# Patient Record
Sex: Male | Born: 1975
Health system: Southern US, Community
[De-identification: ages and names within clinical notes are randomized; demographics above are authoritative.]

## PROBLEM LIST (undated history)

## (undated) ENCOUNTER — Emergency Department (HOSPITAL_COMMUNITY): Admission: EM | Payer: 59 | Source: Home / Self Care

## (undated) DIAGNOSIS — K219 Gastro-esophageal reflux disease without esophagitis: Secondary | ICD-10-CM

## (undated) DIAGNOSIS — I10 Essential (primary) hypertension: Secondary | ICD-10-CM

## (undated) DIAGNOSIS — J449 Chronic obstructive pulmonary disease, unspecified: Secondary | ICD-10-CM

## (undated) HISTORY — PX: HERNIA REPAIR: SHX51

---

## 1999-12-12 ENCOUNTER — Emergency Department (HOSPITAL_COMMUNITY): Admission: EM | Admit: 1999-12-12 | Discharge: 1999-12-12 | Payer: Self-pay | Admitting: Emergency Medicine

## 2000-08-12 ENCOUNTER — Emergency Department (HOSPITAL_COMMUNITY): Admission: EM | Admit: 2000-08-12 | Discharge: 2000-08-12 | Payer: Self-pay | Admitting: Emergency Medicine

## 2011-04-02 ENCOUNTER — Inpatient Hospital Stay (INDEPENDENT_AMBULATORY_CARE_PROVIDER_SITE_OTHER)
Admission: RE | Admit: 2011-04-02 | Discharge: 2011-04-02 | Disposition: A | Discharge status: Home / Self Care | Payer: 59 | Source: Ambulatory Visit | Attending: Family Medicine | Admitting: Family Medicine

## 2011-04-02 ENCOUNTER — Encounter: Payer: Self-pay | Admitting: Family Medicine

## 2011-04-02 DIAGNOSIS — J209 Acute bronchitis, unspecified: Secondary | ICD-10-CM

## 2011-04-02 DIAGNOSIS — E785 Hyperlipidemia, unspecified: Secondary | ICD-10-CM | POA: Insufficient documentation

## 2011-04-02 DIAGNOSIS — I10 Essential (primary) hypertension: Secondary | ICD-10-CM | POA: Insufficient documentation

## 2011-04-28 ENCOUNTER — Encounter: Payer: Self-pay | Admitting: Family Medicine

## 2011-04-28 ENCOUNTER — Inpatient Hospital Stay (INDEPENDENT_AMBULATORY_CARE_PROVIDER_SITE_OTHER)
Admission: RE | Admit: 2011-04-28 | Discharge: 2011-04-28 | Disposition: A | Discharge status: Home / Self Care | Payer: 59 | Source: Ambulatory Visit | Attending: Family Medicine | Admitting: Family Medicine

## 2011-04-28 DIAGNOSIS — J029 Acute pharyngitis, unspecified: Secondary | ICD-10-CM

## 2011-04-28 DIAGNOSIS — K219 Gastro-esophageal reflux disease without esophagitis: Secondary | ICD-10-CM

## 2011-04-28 LAB — CONVERTED CEMR LAB: Rapid Strep: NEGATIVE

## 2011-05-01 ENCOUNTER — Telehealth (INDEPENDENT_AMBULATORY_CARE_PROVIDER_SITE_OTHER): Payer: Self-pay | Admitting: *Deleted

## 2011-06-22 NOTE — Progress Notes (Signed)
Summary: COUGH,SORE THROAT...WSE rm 4   Vital Signs:  Patient Profile:   35 Years Old Male CC:      sores on tongue x 3 days Height:     74 inches Weight:      211.50 pounds O2 Sat:      97 % O2 treatment:    Room Air Temp:     98.3 degrees F oral Pulse rate:   71 / minute Resp:     18 per minute BP sitting:   129 / 85  (left arm) Cuff size:   large  Vitals Entered By: Clemens Catholic LPN (April 28, 2011 5:47 PM)                  Updated Prior Medication List: * LISINOPRIL ? MG TABS (LISINOPRIL) daily  Current Allergies (reviewed today): No known allergies History of Present Illness Chief Complaint: sores on tongue x 3 days History of Present Illness:  Subjective:  Patient reports that 3 days ago he noticed some mild soreness of his tongue upon awakening, and the next day his uvula was slightly swollen upon awakening.  His throa has also been mildly sore, and he has noticed some vague discomfort in his anterior chest when swallowing.  He still has occasional mild cough, but previous illness has resolved.  No increase in nasal congestion.  No fevers, chills, and sweats.  No chest pain.   No definite history of heartburn  REVIEW OF SYSTEMS Constitutional Symptoms      Denies fever, chills, night sweats, weight loss, weight gain, and fatigue.  Eyes       Denies change in vision, eye pain, eye discharge, glasses, contact lenses, and eye surgery. Ear/Nose/Throat/Mouth       Denies hearing loss/aids, change in hearing, ear pain, ear discharge, dizziness, frequent runny nose, frequent nose bleeds, sinus problems, sore throat, hoarseness, and tooth pain or bleeding.  Respiratory       Denies dry cough, productive cough, wheezing, shortness of breath, asthma, bronchitis, and emphysema/COPD.  Cardiovascular       Denies murmurs, chest pain, and tires easily with exhertion.    Gastrointestinal       Denies stomach pain, nausea/vomiting, diarrhea, constipation, blood in bowel  movements, and indigestion. Genitourniary       Denies painful urination, kidney stones, and loss of urinary control. Neurological       Denies paralysis, seizures, and fainting/blackouts. Musculoskeletal       Denies muscle pain, joint pain, joint stiffness, decreased range of motion, redness, swelling, muscle weakness, and gout.  Skin       Denies bruising, unusual mles/lumps or sores, and hair/skin or nail changes.  Psych       Denies mood changes, temper/anger issues, anxiety/stress, speech problems, depression, and sleep problems. Other Comments: pt c/o sores on his tongue x 3 days. no OTC meds.   Past History:  Past Medical History: Reviewed history from 04/02/2011 and no changes required. Hyperlipidemia Hypertension possible seasonal allergies  Past Surgical History: Reviewed history from 04/02/2011 and no changes required. hand surgery nasal surgery  Family History: Reviewed history from 04/02/2011 and no changes required. Family History of CAD  Family History Diabetes  cancer  Social History: Reviewed history from 04/02/2011 and no changes required. married Current Smoker 1 PPD Alcohol use-yes socially Drug use-no   Objective:  Appearance:  Patient appears healthy, stated age, and in no acute distress  Eyes:  Pupils are equal, round, and reactive  to light and accomodation.  Extraocular movement is intact.  Conjunctivae are not inflamed.  Ears:  Canals normal.  Tympanic membranes normal.   Nose:  Mildly congested turbinates.  No sinus tenderness  Mouth:  Geographic tongue Pharynx:  Slightly erythematous Neck:  Supple.  No adenopathy is present.  No thyromegaly is present  Lungs:  Clear to auscultation.  Breath sounds are equal.  Heart:  Regular rate and rhythm without murmurs, rubs, or gallops.  Abdomen:  Nontender without masses or hepatosplenomegaly.  Bowel sounds are present.  No CVA or flank tenderness.  Rapid strep test negative  Assessment New  Problems: ESOPHAGEAL REFLUX (ICD-530.81) ACUTE PHARYNGITIS (ICD-462)  SUSPECT ACID REFLUX EXACERBATED BY RECENT COUGH  Plan New Medications/Changes: OMEPRAZOLE 40 MG CPDR (OMEPRAZOLE) One by mouth Qam 30 min AC  #15 x 1, 04/28/2011, Donna Christen MD  New Orders: Est. Patient Level IV [16109] Rapid Strep [60454] Services provided After hours-Weekends-Holidays [99051] Planning Comments:   Begin Nexium.  Reflux precautions Follow-up with PCP if not improving.   The patient and/or caregiver has been counseled thoroughly with regard to medications prescribed including dosage, schedule, interactions, rationale for use, and possible side effects and they verbalize understanding.  Diagnoses and expected course of recovery discussed and will return if not improved as expected or if the condition worsens. Patient and/or caregiver verbalized understanding.  Prescriptions: OMEPRAZOLE 40 MG CPDR (OMEPRAZOLE) One by mouth Qam 30 min AC  #15 x 1   Entered and Authorized by:   Donna Christen MD   Signed by:   Donna Christen MD on 04/28/2011   Method used:   Print then Give to Patient   RxID:   [id number]   Orders Added: 1)  Est. Patient Level IV [30865] 2)  Rapid Strep [78469] 3)  Services provided After hours-Weekends-Holidays [99051]    Laboratory Results  Date/Time Received: April 28, 2011 6:32 PM  Date/Time Reported: April 28, 2011 6:32 PM   Other Tests  Rapid Strep: negative  Kit Test Internal QC: Negative   (Normal Range: Negative)

## 2011-06-22 NOTE — Progress Notes (Signed)
Summary: COUGH,FEVER,NAUSEA,HEADACHE,SORE THROAT.WSE(room 2)   Vital Signs:  Patient Profile:   35 Years Old Male CC:      URI/ general aches x 9days Height:     74 inches Weight:      215 pounds O2 Sat:      94 % O2 treatment:    Room Air Temp:     98.7 degrees F oral Pulse rate:   84 / minute Resp:     16 per minute BP sitting:   127 / 83  (left arm) Cuff size:   large  Pt. in pain?   yes  Vitals Entered By: Lavell Islam RN (April 02, 2011 8:18 PM)              Comments Patient took Ibuprofen 800mg  at 1730 today after temp of 100.2      Current Allergies: No known allergies History of Present Illness Chief Complaint: URI/ general aches x 9days History of Present Illness:  Subjective: Patient complains of URI symptoms that started about 9 days ago with a scratchy throat, and now has had a persistent cough for a week.  He smokes 1 pack per day    No pleuritic pain ? wheezing + nasal congestion ? post-nasal drainage No sinus pain/pressure No itchy/red eyes No earache No hemoptysis No SOB + low grade fever started today. No nausea No vomiting No abdominal pain No diarrhea No skin rashes No fatigue No myalgias No headache Used OTC meds without relief   REVIEW OF SYSTEMS Constitutional Symptoms       Complains of fever.     Denies chills, night sweats, weight loss, weight gain, and fatigue.  Eyes       Denies change in vision, eye pain, eye discharge, glasses, contact lenses, and eye surgery. Ear/Nose/Throat/Mouth       Complains of sinus problems, sore throat, and hoarseness.      Denies hearing loss/aids, change in hearing, ear pain, ear discharge, dizziness, frequent runny nose, frequent nose bleeds, and tooth pain or bleeding.  Respiratory       Complains of dry cough.      Denies productive cough, wheezing, shortness of breath, asthma, bronchitis, and emphysema/COPD.  Cardiovascular       Complains of tires easily with exhertion.      Denies  murmurs and chest pain.    Gastrointestinal       Complains of nausea/vomiting.      Denies stomach pain, diarrhea, constipation, blood in bowel movements, and indigestion.      Comments: no vomiting Genitourniary       Denies painful urination, kidney stones, and loss of urinary control. Neurological       Complains of headaches.      Denies paralysis, seizures, and fainting/blackouts. Musculoskeletal       Complains of muscle pain and joint pain.      Denies joint stiffness, decreased range of motion, redness, swelling, muscle weakness, and gout.  Skin       Denies bruising, unusual mles/lumps or sores, and hair/skin or nail changes.  Psych       Denies mood changes, temper/anger issues, anxiety/stress, speech problems, depression, and sleep problems. Other Comments: URI and GI problems x 9 days   Past History:  Past Medical History: Hyperlipidemia Hypertension possible seasonal allergies  Past Surgical History: hand surgery nasal surgery  Family History: Family History of CAD  Family History Diabetes  cancer  Social History: married Current Smoker Alcohol use-no Drug  use-no Smoking Status:  current Drug Use:  no   Objective:  No acute distress  Eyes:  Pupils are equal, round, and reactive to light and accomodation.  Extraocular movement is intact.  Conjunctivae are not inflamed.  Ears:  Canals normal.  Tympanic membranes normal.   Nose:  Mildly congested turbinates.  No sinus tenderness  Pharynx:  Normal  Neck:  Supple.  No adenopathy is present.  Lungs:  Faint bibasilar wheezes present.  Breath sounds are equal.  Heart:  Regular rate and rhythm without murmurs, rubs, or gallops.  Abdomen:  Nontender without masses or hepatosplenomegaly.  Bowel sounds are present.  No CVA or flank tenderness.  Extremities:  No edema.   Assessment New Problems: BRONCHITIS, ACUTE (ICD-466.0) FAMILY HISTORY DIABETES 1ST DEGREE RELATIVE (ICD-V18.0) FAMILY HISTORY OF CAD MALE  1ST DEGREE RELATIVE <50 (ICD-V17.3) HYPERTENSION (ICD-401.9) HYPERLIPIDEMIA (ICD-272.4)   Plan New Medications/Changes: CLARITHROMYCIN 500 MG TABS (CLARITHROMYCIN) One Tab by mouth two times a day  #20 x 0, 04/02/2011, Donna Christen MD  New Orders: Pulse Oximetry (single measurment) [phone] Services provided After hours-Weekends-Holidays [99051] New Patient Level III [99203]  The patient and/or caregiver has been counseled thoroughly with regard to medications prescribed including dosage, schedule, interactions, rationale for use, and possible side effects and they verbalize understanding.  Diagnoses and expected course of recovery discussed and will return if not improved as expected or if the condition worsens. Patient and/or caregiver verbalized understanding.  Prescriptions: CLARITHROMYCIN 500 MG TABS (CLARITHROMYCIN) One Tab by mouth two times a day  #20 x 0   Entered and Authorized by:   Donna Christen MD   Signed by:   Donna Christen MD on 04/02/2011   Method used:   Print then Give to Patient   RxID:   [id number]   Patient Instructions: 1)  Take Mucinex D (guaifenesin with decongestant) twice daily for congestion. 2)  Increase fluid intake, rest. 3)  May use Afrin nasal spray (or generic oxymetazoline) twice daily for about 5 days.  Also recommend using saline nasal spray several times daily and/or saline nasal irrigation. 4)  Followup with family doctor if not improving 7 to 10 days.   Orders Added: 1)  Pulse Oximetry (single measurment) [94760] 2)  Services provided After hours-Weekends-Holidays [99051] 3)  New Patient Level III [78469]

## 2011-06-22 NOTE — Telephone Encounter (Signed)
  Phone Note Outgoing Call Call back at [facility] Phone [phone]   Call placed by: Lajean Saver RN,  May 01, 2011 1:44 PM Call placed to: Patient Summary of Call: Callback: No answer. Message left to call with questions or concerns

## 2011-07-20 ENCOUNTER — Encounter: Payer: Self-pay | Admitting: Emergency Medicine

## 2011-07-20 ENCOUNTER — Emergency Department
Admission: EM | Admit: 2011-07-20 | Discharge: 2011-07-20 | Disposition: A | Discharge status: Home / Self Care | Payer: 59 | Source: Home / Self Care | Attending: Emergency Medicine | Admitting: Emergency Medicine

## 2011-07-20 DIAGNOSIS — R059 Cough, unspecified: Secondary | ICD-10-CM

## 2011-07-20 DIAGNOSIS — R05 Cough: Secondary | ICD-10-CM

## 2011-07-20 DIAGNOSIS — J209 Acute bronchitis, unspecified: Secondary | ICD-10-CM

## 2011-07-20 HISTORY — DX: Essential (primary) hypertension: I10

## 2011-07-20 MED ORDER — GUAIFENESIN-CODEINE 100-10 MG/5ML PO SYRP: 5.0000 mL | ORAL_SOLUTION | Freq: Four times a day (QID) | ORAL | Status: AC | PRN

## 2011-07-20 MED ORDER — PREDNISONE (PAK) 10 MG PO TABS: 10.0000 mg | ORAL_TABLET | Freq: Every day | ORAL | Status: AC

## 2011-07-20 MED ORDER — LEVOFLOXACIN 500 MG PO TABS: 500.0000 mg | ORAL_TABLET | Freq: Every day | ORAL | Status: AC

## 2011-07-20 NOTE — ED Notes (Signed)
Cough x 1 week, intermittent nausea, fever last night

## 2011-07-20 NOTE — ED Provider Notes (Signed)
History     CSN: [id number]  Arrival date & time 07/20/11  0811   First MD Initiated Contact with Patient 07/20/11 [phone]      Chief Complaint  Patient presents with  . Cough    (Consider location/radiation/quality/duration/timing/severity/associated sxs/prior treatment) HPI Randall Swanson is a 35 y.o. male who complains of onset of cold symptoms for 7 days. Fever since yesterday (100.8). No sore throat + cough No pleuritic pain No wheezing + nasal congestion + post-nasal drainage + sinus pain/pressure + chest congestion + wheeze No itchy/red eyes No earache No hemoptysis No SOB + chills/sweats + fever No nausea No vomiting No abdominal pain No diarrhea No skin rashes No fatigue No myalgias No headache    Past Medical History  Diagnosis Date  . Hypertension     History reviewed. No pertinent past surgical history.  No family history on file.  History  Substance Use Topics  . Smoking status: Not on file  . Smokeless tobacco: Not on file  . Alcohol Use:       Review of Systems  Allergies  Review of patient's allergies indicates no known allergies.  Home Medications   Current Outpatient Rx  Name Route Sig Dispense Refill  . LISINOPRIL 10 MG PO TABS Oral Take 10 mg by mouth daily.        BP 130/83  Pulse 92  Temp(Src) 99.5 F (37.5 C) (Oral)  Resp 18  Ht 6\' 2"  (1.88 m)  Wt 207 lb (93.895 kg)  BMI 26.58 kg/m2  SpO2 96%  Physical Exam  Nursing note and vitals reviewed. Constitutional: He is oriented to person, place, and time. He appears well-developed and well-nourished.  HENT:  Head: Normocephalic and atraumatic.  Right Ear: Tympanic membrane, external ear and ear canal normal.  Left Ear: Tympanic membrane, external ear and ear canal normal.  Nose: Mucosal edema and rhinorrhea present.  Mouth/Throat: Posterior oropharyngeal erythema present. No oropharyngeal exudate or posterior oropharyngeal edema.  Eyes: No scleral icterus.  Neck: Neck  supple.  Cardiovascular: Regular rhythm and normal heart sounds.   Pulmonary/Chest: Effort normal and breath sounds normal. No respiratory distress.  Neurological: He is alert and oriented to person, place, and time.  Skin: Skin is warm and dry.  Psychiatric: He has a normal mood and affect. His speech is normal.    ED Course  Procedures (including critical care time)  Labs Reviewed - No data to display No results found.   No diagnosis found.    MDM  1)  Take the prescribed antibiotic as instructed. 2)  Use nasal saline solution (over the counter) at least 3 times a day. 3)  Use over the counter decongestants like Zyrtec-D every 12 hours as needed to help with congestion.  If you have hypertension, do not take medicines with sudafed.  4)  Can take tylenol every 6 hours or motrin every 8 hours for pain or fever. 5)  Follow up with your primary doctor if no improvement in 5-7 days, sooner if increasing pain, fever, or new symptoms.     Lily Kocher, MD 07/20/11 [phone]

## 2011-08-06 ENCOUNTER — Emergency Department (HOSPITAL_COMMUNITY)
Admission: EM | Admit: 2011-08-06 | Discharge: 2011-08-06 | Disposition: A | Discharge status: Home / Self Care | Payer: 59 | Source: Home / Self Care | Attending: Family Medicine | Admitting: Family Medicine

## 2011-08-06 ENCOUNTER — Encounter (HOSPITAL_COMMUNITY): Payer: Self-pay | Admitting: *Deleted

## 2011-08-06 DIAGNOSIS — K219 Gastro-esophageal reflux disease without esophagitis: Secondary | ICD-10-CM

## 2011-08-06 MED ORDER — OMEPRAZOLE 20 MG PO CPDR: 20.0000 mg | DELAYED_RELEASE_CAPSULE | Freq: Every day | ORAL | Status: DC

## 2011-08-06 NOTE — ED Provider Notes (Signed)
History     CSN: [id number]  Arrival date & time 08/06/11  4098   First MD Initiated Contact with Patient 08/06/11 0818      Chief Complaint  Patient presents with  . Neck Pain    (Consider location/radiation/quality/duration/timing/severity/associated sxs/prior treatment) HPI Comments: 36 y/o male smoker h/o recent febrile cold diagnosed as bronchitis and resolving s/p antibiotics for 7 days finished about 1 week ago. Here c/o sore throat. No fever or chills. States throat hurts more on right side of his neck. Is constant no exacerbated by swallowing. His mother is on short stay having surgery he wanted to have it check good appetite. Has persistent cough he says he has recurrent sore throat and persistent cough even before his cold symptoms. Has been told he has GERD in the past no taking acid reflux medications.    Past Medical History  Diagnosis Date  . Hypertension     History reviewed. No pertinent past surgical history.  History reviewed. No pertinent family history.  History  Substance Use Topics  . Smoking status: Current Everyday Smoker  . Smokeless tobacco: Not on file  . Alcohol Use: No      Review of Systems  Constitutional: Negative for fever, chills and appetite change.  HENT: Positive for sore throat.   Respiratory: Positive for cough. Negative for shortness of breath and wheezing.   Gastrointestinal: Negative for nausea and vomiting.  Musculoskeletal: Negative for myalgias and arthralgias.  Skin: Negative for rash.  Neurological: Negative for headaches.    Allergies  Review of patient's allergies indicates no known allergies.  Home Medications   Current Outpatient Rx  Name Route Sig Dispense Refill  . LISINOPRIL 10 MG PO TABS Oral Take 10 mg by mouth daily.      Marland Kitchen OMEPRAZOLE 20 MG PO CPDR Oral Take 1 capsule (20 mg total) by mouth daily. 30 capsule 0    BP 128/78  Pulse 54  Temp(Src) 98 F (36.7 C) (Oral)  Resp 16  SpO2 96%  Physical  Exam  Nursing note and vitals reviewed. Constitutional: He is oriented to person, place, and time. He appears well-developed and well-nourished. No distress.  HENT:  Right Ear: External ear normal.  Left Ear: External ear normal.       Nose normal.  Pharyngeal erythema, no swelling or exudates.  TMs normal  Eyes: Conjunctivae and EOM are normal. Pupils are equal, round, and reactive to light.  Neck: Normal range of motion. Neck supple. No thyromegaly present.  Cardiovascular: Normal rate, regular rhythm and normal heart sounds.   Pulmonary/Chest: Breath sounds normal. No respiratory distress. He has no wheezes. He has no rales. He exhibits no tenderness.  Abdominal: Soft. There is no tenderness.  Lymphadenopathy:    He has no cervical adenopathy.  Neurological: He is alert and oriented to person, place, and time.  Skin: No rash noted.    ED Course  Procedures (including critical care time)  Labs Reviewed - No data to display No results found.   1. GERD (gastroesophageal reflux disease)       MDM  Encouraged to quit smoking. Prescribed PPI. Follow up with primary consider change ACE if persistent cough and throat irritation.        Sharin Grave, MD 08/07/11 1191

## 2011-08-06 NOTE — ED Notes (Signed)
Pt is here with complaints of right sided neck/throat pain with onset 3 days ago.  Pt denies pain when swallowing.  Reports increased pain when pressing on the right side of his neck.  Pt was recently treated for bronchitis and treated with an antibiotic.  Pt reports lingering cough.

## 2012-01-05 ENCOUNTER — Ambulatory Visit: Payer: 59 | Attending: Neurology | Admitting: Physical Therapy

## 2012-01-05 DIAGNOSIS — M2569 Stiffness of other specified joint, not elsewhere classified: Secondary | ICD-10-CM | POA: Insufficient documentation

## 2012-01-05 DIAGNOSIS — M542 Cervicalgia: Secondary | ICD-10-CM | POA: Insufficient documentation

## 2012-01-05 DIAGNOSIS — IMO0001 Reserved for inherently not codable concepts without codable children: Secondary | ICD-10-CM | POA: Insufficient documentation

## 2012-01-13 ENCOUNTER — Ambulatory Visit: Payer: 59 | Admitting: Physical Therapy

## 2012-01-18 ENCOUNTER — Ambulatory Visit: Payer: 59 | Attending: Neurology | Admitting: Rehabilitation

## 2012-01-18 DIAGNOSIS — M2569 Stiffness of other specified joint, not elsewhere classified: Secondary | ICD-10-CM | POA: Insufficient documentation

## 2012-01-18 DIAGNOSIS — M542 Cervicalgia: Secondary | ICD-10-CM | POA: Insufficient documentation

## 2012-01-18 DIAGNOSIS — IMO0001 Reserved for inherently not codable concepts without codable children: Secondary | ICD-10-CM | POA: Insufficient documentation

## 2012-01-26 ENCOUNTER — Ambulatory Visit: Payer: 59 | Admitting: Rehabilitation

## 2012-02-01 ENCOUNTER — Other Ambulatory Visit (HOSPITAL_BASED_OUTPATIENT_CLINIC_OR_DEPARTMENT_OTHER): Payer: Self-pay | Admitting: Neurology

## 2012-02-01 ENCOUNTER — Ambulatory Visit (HOSPITAL_BASED_OUTPATIENT_CLINIC_OR_DEPARTMENT_OTHER)
Admission: RE | Admit: 2012-02-01 | Discharge: 2012-02-01 | Disposition: A | Discharge status: Home / Self Care | Payer: 59 | Source: Ambulatory Visit | Attending: Neurology | Admitting: Neurology

## 2012-02-01 ENCOUNTER — Encounter (HOSPITAL_BASED_OUTPATIENT_CLINIC_OR_DEPARTMENT_OTHER): Payer: Self-pay

## 2012-02-01 DIAGNOSIS — R42 Dizziness and giddiness: Secondary | ICD-10-CM | POA: Insufficient documentation

## 2012-02-01 DIAGNOSIS — M542 Cervicalgia: Secondary | ICD-10-CM

## 2012-02-01 MED ORDER — IOHEXOL 350 MG/ML SOLN
100.0000 mL | Freq: Once | INTRAVENOUS | Status: AC | PRN
  Administered 2012-02-01: 100 mL via INTRAVENOUS

## 2012-02-02 ENCOUNTER — Other Ambulatory Visit (HOSPITAL_BASED_OUTPATIENT_CLINIC_OR_DEPARTMENT_OTHER): Payer: 59

## 2012-03-02 ENCOUNTER — Ambulatory Visit: Payer: 59 | Attending: Neurology | Admitting: Physical Therapy

## 2012-03-02 DIAGNOSIS — M2569 Stiffness of other specified joint, not elsewhere classified: Secondary | ICD-10-CM | POA: Insufficient documentation

## 2012-03-02 DIAGNOSIS — IMO0001 Reserved for inherently not codable concepts without codable children: Secondary | ICD-10-CM | POA: Insufficient documentation

## 2012-03-02 DIAGNOSIS — M542 Cervicalgia: Secondary | ICD-10-CM | POA: Insufficient documentation

## 2012-03-08 ENCOUNTER — Ambulatory Visit: Payer: 59 | Admitting: Rehabilitation

## 2012-03-14 ENCOUNTER — Ambulatory Visit: Payer: 59 | Admitting: Rehabilitation

## 2012-03-24 ENCOUNTER — Ambulatory Visit: Payer: 59 | Attending: Neurology | Admitting: Physical Therapy

## 2012-03-24 DIAGNOSIS — IMO0001 Reserved for inherently not codable concepts without codable children: Secondary | ICD-10-CM | POA: Insufficient documentation

## 2012-03-24 DIAGNOSIS — M542 Cervicalgia: Secondary | ICD-10-CM | POA: Insufficient documentation

## 2012-07-06 ENCOUNTER — Encounter: Payer: Self-pay | Admitting: *Deleted

## 2012-07-06 ENCOUNTER — Emergency Department
Admission: EM | Admit: 2012-07-06 | Discharge: 2012-07-06 | Disposition: A | Discharge status: Home / Self Care | Payer: 59 | Source: Home / Self Care | Attending: Family Medicine | Admitting: Family Medicine

## 2012-07-06 DIAGNOSIS — J309 Allergic rhinitis, unspecified: Secondary | ICD-10-CM

## 2012-07-06 DIAGNOSIS — J069 Acute upper respiratory infection, unspecified: Secondary | ICD-10-CM

## 2012-07-06 MED ORDER — FLUTICASONE PROPIONATE 50 MCG/ACT NA SUSP: 2.0000 | Freq: Every day | NASAL | Status: DC

## 2012-07-06 MED ORDER — CETIRIZINE HCL 10 MG PO CAPS: 10.0000 mg | ORAL_CAPSULE | Freq: Every day | ORAL | Status: DC

## 2012-07-06 NOTE — ED Notes (Signed)
Pt c/o cough, chest congestion, and nasal congestion x 2 days. Denies fever. He has taken IBF.

## 2012-07-06 NOTE — ED Provider Notes (Signed)
History     CSN: [id number]  Arrival date & time 07/06/12  1241   First MD Initiated Contact with Patient 07/06/12 1258      Chief Complaint  Patient presents with  . Nasal Congestion  . Cough   HPI  URI Symptoms Onset: 2-3 days  Description: rhinorrhea, nasal congestion, sinus drainage and cough  Modifying factors:  Has daughter in NICU, just wanted to be checked   Symptoms Nasal discharge: yes Fever: no Sore throat: in morning, resolved up waking up  Cough: mild Wheezing: no Ear pain: no GI symptoms: no Sick contacts: yes  Red Flags  Stiff neck: no Dyspnea: no Rash: no Swallowing difficulty: no  Sinusitis Risk Factors Headache/face pain: no Double sickening: no tooth pain: no  Allergy Risk Factors Sneezing: no Itchy scratchy throat: yes Seasonal symptoms: yes  Flu Risk Factors Headache: no muscle aches: no severe fatigue: no   Past Medical History  Diagnosis Date  . Hypertension     Past Surgical History  Procedure Date  . Hernia repair     Family History  Problem Relation Age of Onset  . Hypertension Mother   . Hypertension Father     History  Substance Use Topics  . Smoking status: Current Every Day Smoker  . Smokeless tobacco: Not on file  . Alcohol Use: No      Review of Systems  All other systems reviewed and are negative.    Allergies  Review of patient's allergies indicates no known allergies.  Home Medications   Current Outpatient Rx  Name  Route  Sig  Dispense  Refill  . UNKNOWN TO PATIENT               . LISINOPRIL 10 MG PO TABS   Oral   Take 10 mg by mouth daily.           Marland Kitchen OMEPRAZOLE 20 MG PO CPDR   Oral   Take 1 capsule (20 mg total) by mouth daily.   30 capsule   0     BP 127/83  Pulse 86  Temp 97.9 F (36.6 C) (Oral)  Resp 16  Ht 6\' 2"  (1.88 m)  Wt 222 lb (100.699 kg)  BMI 28.50 kg/m2  SpO2 97%  Physical Exam  Constitutional: He appears well-developed and well-nourished.  HENT:   Head: Normocephalic and atraumatic.  Right Ear: External ear normal.  Left Ear: External ear normal.       +nasal erythema, rhinorrhea bilaterally, + post oropharyngeal erythema    Eyes: Conjunctivae normal are normal. Pupils are equal, round, and reactive to light.  Neck: Normal range of motion. Neck supple.  Cardiovascular: Normal rate, regular rhythm and normal heart sounds.   Pulmonary/Chest: Effort normal and breath sounds normal.  Abdominal: Soft.  Musculoskeletal: Normal range of motion.  Lymphadenopathy:    He has no cervical adenopathy.  Neurological: He is alert.  Skin: Skin is warm.    ED Course  Procedures (including critical care time)  Labs Reviewed - No data to display No results found.   1. URI (upper respiratory infection)   2. Allergic rhinitis       MDM  Likely viral process with underlying allergic disease.  No current indications for antibiotics  Will start on flonase and zyrtec.  Discussed infectious and resp red flags.  Follow up as needed.      The patient and/or caregiver has been counseled thoroughly with regard to treatment plan and/or medications prescribed  including dosage, schedule, interactions, rationale for use, and possible side effects and they verbalize understanding. Diagnoses and expected course of recovery discussed and will return if not improved as expected or if the condition worsens. Patient and/or caregiver verbalized understanding.             [name], MD 07/06/12 1304

## 2012-11-11 ENCOUNTER — Encounter: Payer: Self-pay | Admitting: *Deleted

## 2012-11-11 ENCOUNTER — Emergency Department
Admission: EM | Admit: 2012-11-11 | Discharge: 2012-11-11 | Disposition: A | Discharge status: Home / Self Care | Payer: 59 | Source: Home / Self Care | Attending: Family Medicine | Admitting: Family Medicine

## 2012-11-11 DIAGNOSIS — H01009 Unspecified blepharitis unspecified eye, unspecified eyelid: Secondary | ICD-10-CM

## 2012-11-11 DIAGNOSIS — H01006 Unspecified blepharitis left eye, unspecified eyelid: Secondary | ICD-10-CM

## 2012-11-11 MED ORDER — DOXYCYCLINE HYCLATE 100 MG PO CAPS: 100.0000 mg | ORAL_CAPSULE | Freq: Two times a day (BID) | ORAL | Status: AC

## 2012-11-11 MED ORDER — AZITHROMYCIN 1 % OP SOLN: 1.0000 [drp] | Freq: Every day | OPHTHALMIC | Status: AC

## 2012-11-11 MED ORDER — POLYMYXIN B-TRIMETHOPRIM 10000-0.1 UNIT/ML-% OP SOLN: 1.0000 [drp] | OPHTHALMIC | Status: DC

## 2012-11-11 NOTE — ED Provider Notes (Signed)
History     CSN: [id number]  Arrival date & time 11/11/12  4098   First MD Initiated Contact with Patient 11/11/12 [phone]      Chief Complaint  Patient presents with  . Eye Problem   HPI  L eyelid pain x 1 day Pt woke this am with L upper eyelid swelling Area has been mildly tender.  No direct eye pain or LOV.  No headache.  Has had a mild viral gastroenteritis that has been improving.  Otherwise no symptoms.   Past Medical History  Diagnosis Date  . Hypertension     Past Surgical History  Procedure Laterality Date  . Hernia repair      Family History  Problem Relation Age of Onset  . Hypertension Mother   . Hypertension Father     History  Substance Use Topics  . Smoking status: Current Every Day Smoker  . Smokeless tobacco: Not on file  . Alcohol Use: No      Review of Systems  All other systems reviewed and are negative.    Allergies  Review of patient's allergies indicates no known allergies.  Home Medications   Current Outpatient Rx  Name  Route  Sig  Dispense  Refill  . azithromycin (AZASITE) 1 % ophthalmic solution   Left Eye   Place 1 drop into the left eye daily.   2.5 mL   0     Please disregard previous polytrim rx.   . Cetirizine HCl 10 MG CAPS   Oral   Take 1 capsule (10 mg total) by mouth daily.   30 capsule   3   . doxycycline (VIBRAMYCIN) 100 MG capsule   Oral   Take 1 capsule (100 mg total) by mouth 2 (two) times daily.   14 capsule   0   . fluticasone (FLONASE) 50 MCG/ACT nasal spray   Nasal   Place 2 sprays into the nose daily.   16 g   12   . lisinopril (PRINIVIL,ZESTRIL) 10 MG tablet   Oral   Take 10 mg by mouth daily.           Marland Kitchen EXPIRED: omeprazole (PRILOSEC) 20 MG capsule   Oral   Take 1 capsule (20 mg total) by mouth daily.   30 capsule   0   . UNKNOWN TO PATIENT                 BP 118/80  Pulse 61  Temp(Src) 97.9 F (36.6 C) (Oral)  Resp 18  Ht 6\' 2"  (1.88 m)  Wt 235 lb (106.595 kg)   BMI 30.16 kg/m2  SpO2 97%  Physical Exam  Constitutional: He appears well-developed and well-nourished.  HENT:  Head: Normocephalic and atraumatic.  Right Ear: External ear normal.  Left Ear: External ear normal.  Eyes: Pupils are equal, round, and reactive to light.  Mild L upper eyelid swelling  No foreign bodies EOMI  No conjunctivitis No drainage    Neck: Normal range of motion. Neck supple.  Cardiovascular: Normal rate and regular rhythm.   Pulmonary/Chest: Effort normal and breath sounds normal.  Abdominal: Soft.  Musculoskeletal: Normal range of motion.  Neurological: He is alert.  Skin: Skin is warm.    ED Course  Procedures (including critical care time)  Labs Reviewed - No data to display No results found.   1. Blepharitis, left       MDM  Will start on opthalmic azithro for treatment  Doxy if sxs  not improved w/in 24-48 hours.  Discussed infectious and ophtho red flags at length.  Follow up as needed.     The patient and/or caregiver has been counseled thoroughly with regard to treatment plan and/or medications prescribed including dosage, schedule, interactions, rationale for use, and possible side effects and they verbalize understanding. Diagnoses and expected course of recovery discussed and will return if not improved as expected or if the condition worsens. Patient and/or caregiver verbalized understanding.             Randall Albee, MD 11/11/12 1123

## 2012-11-11 NOTE — ED Notes (Signed)
Pt c/o LT eye lid swelling and pain x this morning. Denies redness. He also reports that he had diarrhea, abd cramping, and body aches yesterday.

## 2013-06-18 ENCOUNTER — Emergency Department
Admission: EM | Admit: 2013-06-18 | Discharge: 2013-06-18 | Disposition: A | Discharge status: Home / Self Care | Payer: 59 | Source: Home / Self Care | Attending: Family Medicine | Admitting: Family Medicine

## 2013-06-18 ENCOUNTER — Encounter: Payer: Self-pay | Admitting: Emergency Medicine

## 2013-06-18 DIAGNOSIS — L309 Dermatitis, unspecified: Secondary | ICD-10-CM

## 2013-06-18 DIAGNOSIS — J029 Acute pharyngitis, unspecified: Secondary | ICD-10-CM

## 2013-06-18 DIAGNOSIS — L259 Unspecified contact dermatitis, unspecified cause: Secondary | ICD-10-CM

## 2013-06-18 HISTORY — DX: Chronic obstructive pulmonary disease, unspecified: J44.9

## 2013-06-18 HISTORY — DX: Gastro-esophageal reflux disease without esophagitis: K21.9

## 2013-06-18 MED ORDER — AMOXICILLIN 875 MG PO TABS: 875.0000 mg | ORAL_TABLET | Freq: Two times a day (BID) | ORAL | Status: DC

## 2013-06-18 MED ORDER — TRIAMCINOLONE ACETONIDE 0.1 % EX CREA: 1.0000 "application " | TOPICAL_CREAM | Freq: Two times a day (BID) | CUTANEOUS | Status: DC

## 2013-06-18 NOTE — ED Provider Notes (Signed)
CSN: [id number]     Arrival date & time 06/18/13  1130 History   First MD Initiated Contact with Patient 06/18/13 1215     Chief Complaint  Patient presents with  . Sore Throat  . Rash      HPI Comments: Patient presents with two problems: 1)  This morning he awoke with a mild sore throat and noticed that his uvula was slightly swollen.  He has no difficulty swallowing.  He has also had some minimal increase in nasal congestion. 2)  About 1.5 weeks ago he developed a rash on both elbows; lesions not painful but are mildly pruritic.  No response to 1% HC cream.  The history is provided by the patient.    Past Medical History  Diagnosis Date  . Hypertension   . COPD (chronic obstructive pulmonary disease)   . Acid reflux    Past Surgical History  Procedure Laterality Date  . Hernia repair     Family History  Problem Relation Age of Onset  . Hypertension Mother   . Hypertension Father    History  Substance Use Topics  . Smoking status: Current Every Day Smoker  . Smokeless tobacco: Not on file  . Alcohol Use: No    Review of Systems + sore throat No cough No pleuritic pain No wheezing + nasal congestion ? post-nasal drainage No sinus pain/pressure No itchy/red eyes No earache No hemoptysis No SOB No fever/chills No nausea No vomiting No abdominal pain No diarrhea No urinary symptoms + skin rash on elbows No fatigue No myalgias No headache Used OTC meds without relief  Allergies  Review of patient's allergies indicates no known allergies.  Home Medications   Current Outpatient Rx  Name  Route  Sig  Dispense  Refill  . amoxicillin (AMOXIL) 875 MG tablet   Oral   Take 1 tablet (875 mg total) by mouth 2 (two) times daily. (Rx void after 05/27/13)   20 tablet   0   . Cetirizine HCl 10 MG CAPS   Oral   Take 1 capsule (10 mg total) by mouth daily.   30 capsule   3   . fluticasone (FLONASE) 50 MCG/ACT nasal spray   Nasal   Place 2 sprays into the  nose daily.   16 g   12   . lisinopril (PRINIVIL,ZESTRIL) 10 MG tablet   Oral   Take 10 mg by mouth daily.           Marland Kitchen EXPIRED: omeprazole (PRILOSEC) 20 MG capsule   Oral   Take 1 capsule (20 mg total) by mouth daily.   30 capsule   0   . triamcinolone cream (KENALOG) 0.1 %   Topical   Apply 1 application topically 2 (two) times daily.   30 g   0   . UNKNOWN TO PATIENT                BP 135/80  Pulse 58  Temp(Src) 97.7 F (36.5 C) (Oral)  Resp 16  Ht 6\' 2"  (1.88 m)  Wt 221 lb 8 oz (100.472 kg)  BMI 28.43 kg/m2  SpO2 98% Physical Exam  Constitutional: He is oriented to person, place, and time. He appears well-developed and well-nourished. No distress.  HENT:  Head: Normocephalic.  Nose: Nose normal.  Mouth/Throat: Uvula is midline and oropharynx is clear and moist. No oropharyngeal exudate.  Uvula is slightly edematous, and remainder of pharynx has minimal erythema  Eyes: Conjunctivae are normal. Pupils  are equal, round, and reactive to light. Right eye exhibits no discharge. Left eye exhibits no discharge.  Neck: Neck supple.  Cardiovascular: Normal heart sounds.   Pulmonary/Chest: Breath sounds normal.  Abdominal: There is no tenderness.  Lymphadenopathy:    He has no cervical adenopathy.  Shotty posterior cervical nodes are present.  Neurological: He is alert and oriented to person, place, and time.  Skin: Skin is warm and dry. Rash noted. Rash is macular.     Both elbows have a 3cm dia macular patch of non-specific appearing erythema over the olecranon.  No tenderness or swelling     ED Course  Procedures  none    Labs Reviewed  STREP A DNA PROBE  POCT RAPID STREP A (OFFICE) negative         MDM   1. Acute pharyngitis; suspect early viral URI   2. Dermatitis, eczematous    Rx written for triamcinolone cream There is no evidence of bacterial infection today.  Throat culture pending. Treat symptomatically for now: If increasing cold  symptoms develop: Take plain Mucinex (1200mg  guaifenesin) twice daily for cough and congestion.  Increase fluid intake, rest. May use Afrin nasal spray (or generic oxymetazoline) twice daily for about 5 days.  Also recommend using saline nasal spray several times daily and saline nasal irrigation (AYR is a common brand) Stop all antihistamines for now, and other non-prescription cough/cold preparations. May take Ibuprofen 200mg , 4 tabs every 8 hours with food for sore throat May take Delsym Cough Suppressant at bedtime for nighttime cough.  Begin Amoxicillin if throat culture positive, if not improving about one week, or if persistent fever develops (Given a prescription to hold, with an expiration date)  Follow-up with family doctor if not improving10 days.     Lattie Haw, MD 06/19/13 [phone]

## 2013-06-18 NOTE — ED Notes (Signed)
Patient c/o sore throat since this morning. Patient also c/o rash on elbows x 1 wk' patient has tried OTC Hydrocortisone, witch hazel and daughter's steroid cream without relief.

## 2013-06-21 ENCOUNTER — Telehealth: Payer: Self-pay | Admitting: *Deleted

## 2013-10-30 ENCOUNTER — Emergency Department
Admission: EM | Admit: 2013-10-30 | Discharge: 2013-10-30 | Disposition: A | Discharge status: Home / Self Care | Payer: 59 | Source: Home / Self Care | Attending: Emergency Medicine | Admitting: Emergency Medicine

## 2013-10-30 ENCOUNTER — Encounter: Payer: Self-pay | Admitting: Emergency Medicine

## 2013-10-30 DIAGNOSIS — J0100 Acute maxillary sinusitis, unspecified: Secondary | ICD-10-CM

## 2013-10-30 DIAGNOSIS — J209 Acute bronchitis, unspecified: Secondary | ICD-10-CM

## 2013-10-30 MED ORDER — AZITHROMYCIN 250 MG PO TABS: ORAL_TABLET | ORAL | Status: DC

## 2013-10-30 MED ORDER — PREDNISONE (PAK) 10 MG PO TABS: ORAL_TABLET | ORAL | Status: DC

## 2013-10-30 NOTE — ED Notes (Signed)
Pt c/o nasal congestion, cough, sinus pain, and chest congestion x 3 days. Denies fever.

## 2013-10-30 NOTE — ED Provider Notes (Signed)
CSN: [id number]     Arrival date & time 10/30/13  1926 History   First MD Initiated Contact with Patient 10/30/13 1953     Chief Complaint  Patient presents with  . Nasal Congestion  . Cough   (Consider location/radiation/quality/duration/timing/severity/associated sxs/prior Treatment) HPI URI HISTORY  Randall Swanson is a 38 y.o. male who complains of 3 days of progressively worsening cold symptoms with sinus congestion, discolored rhinorrhea, bilateral maxillary pain and pressure, progressed to cough occasionally productive of scant yellow sputum. No fever or chills    Have been using over-the-counter treatment which helps a little bit. History of smoking for several years, and quit, but has resumed smoking a few cigarettes a day for the past month or 2. Diagnosed with COPD, had been prescribed Symbicort by his physician, and his wheezing and chest congestion resolved on Symbicort and quitting smoking.--He states he hasn't used the Symbicort in a month.  No chills/sweats No definite  Fever  +  Nasal congestion +  Discolored Post-nasal drainage Positive sinus pain/pressure No sore throat  +  cough No wheezing Mild chest congestion No hemoptysis No shortness of breath No pleuritic pain  No itchy/red eyes No earache  No nausea No vomiting No abdominal pain No diarrhea  No skin rashes +  Fatigue No myalgias No headache   Past Medical History  Diagnosis Date  . Hypertension   . COPD (chronic obstructive pulmonary disease)   . Acid reflux    Past Surgical History  Procedure Laterality Date  . Hernia repair     Family History  Problem Relation Age of Onset  . Hypertension Mother   . Hypertension Father    History  Substance Use Topics  . Smoking status: Current Every Day Smoker  . Smokeless tobacco: Not on file  . Alcohol Use: No    Review of Systems  All other systems reviewed and are negative.   Allergies  Review of patient's allergies indicates no known  allergies.  Home Medications   Current Outpatient Rx  Name  Route  Sig  Dispense  Refill  . amoxicillin (AMOXIL) 875 MG tablet   Oral   Take 1 tablet (875 mg total) by mouth 2 (two) times daily. (Rx void after 05/27/13)   20 tablet   0   . azithromycin (ZITHROMAX Z-PAK) 250 MG tablet      Take 2 tablets on day one, then 1 tablet daily on days 2 through 5   1 each   0   . Cetirizine HCl 10 MG CAPS   Oral   Take 1 capsule (10 mg total) by mouth daily.   30 capsule   3   . fluticasone (FLONASE) 50 MCG/ACT nasal spray   Nasal   Place 2 sprays into the nose daily.   16 g   12   . lisinopril (PRINIVIL,ZESTRIL) 10 MG tablet   Oral   Take 10 mg by mouth daily.           Marland Kitchen. EXPIRED: omeprazole (PRILOSEC) 20 MG capsule   Oral   Take 1 capsule (20 mg total) by mouth daily.   30 capsule   0   . predniSONE (STERAPRED UNI-PAK) 10 MG tablet      Take as directed for 6 days.--Take 6 on day 1, 5 on day 2, 4 on day 3, then 3 tablets on day 4, then 2 tablets on day 5, then 1 on day 6.   21 tablet   0   .  triamcinolone cream (KENALOG) 0.1 %   Topical   Apply 1 application topically 2 (two) times daily.   30 g   0   . UNKNOWN TO PATIENT                BP 146/86  Pulse 69  Temp(Src) 98 F (36.7 C) (Oral)  Resp 18  Wt 231 lb (104.781 kg)  SpO2 97% Physical Exam  Nursing note and vitals reviewed. Constitutional: He is oriented to person, place, and time. He appears well-developed and well-nourished. No distress.  HENT:  Head: Normocephalic and atraumatic.  Right Ear: Tympanic membrane, external ear and ear canal normal.  Left Ear: Tympanic membrane, external ear and ear canal normal.  Nose: Mucosal edema and rhinorrhea present. Right sinus exhibits maxillary sinus tenderness. Left sinus exhibits maxillary sinus tenderness.  Mouth/Throat: Oropharynx is clear and moist. No oral lesions. No oropharyngeal exudate.  Eyes: Right eye exhibits no discharge. Left eye  exhibits no discharge. No scleral icterus.  Neck: Neck supple.  Cardiovascular: Normal rate, regular rhythm and normal heart sounds.   Pulmonary/Chest: Effort normal. No respiratory distress. He has no wheezes. He has rhonchi. He has no rales.  Lymphadenopathy:    He has no cervical adenopathy.  Neurological: He is alert and oriented to person, place, and time.  Skin: Skin is warm and dry.    ED Course  Procedures (including critical care time) Labs Review Labs Reviewed - No data to display Imaging Review No results found.   MDM   1. Acute bronchitis   2. Acute maxillary sinusitis    Treatment options discussed, as well as risks, benefits, alternatives. Patient voiced understanding and agreement with the following plans: Z-Pak Written prescription for prednisone 10 mg-6 day Dosepak , to fill if not improving in 2 days.--He declined to start taking that now, even with his history of COPD. (He declined the option of injection of Depo-Medrol) Urged to quit smoking and explained and risks of not doing so. Advised to resume the Symbicort that he has at home that was prescribed by his physician. Other symptomatic care discussed .--He declined prescription for steroid nasal spray Follow-up with your primary care doctor in 5-7 days if not improving, or sooner if symptoms become worse. Precautions discussed. Red flags discussed. Questions invited and answered. Patient voiced understanding and agreement.     Lajean Manesavid Massey, MD 10/30/13 2026

## 2015-02-02 ENCOUNTER — Encounter (HOSPITAL_BASED_OUTPATIENT_CLINIC_OR_DEPARTMENT_OTHER): Payer: Self-pay | Admitting: *Deleted

## 2015-02-02 ENCOUNTER — Emergency Department (HOSPITAL_BASED_OUTPATIENT_CLINIC_OR_DEPARTMENT_OTHER): Payer: 59

## 2015-02-02 ENCOUNTER — Emergency Department (HOSPITAL_BASED_OUTPATIENT_CLINIC_OR_DEPARTMENT_OTHER)
Admission: EM | Admit: 2015-02-02 | Discharge: 2015-02-02 | Disposition: A | Discharge status: Home / Self Care | Payer: 59 | Attending: Emergency Medicine | Admitting: Emergency Medicine

## 2015-02-02 DIAGNOSIS — R079 Chest pain, unspecified: Secondary | ICD-10-CM | POA: Diagnosis not present

## 2015-02-02 DIAGNOSIS — Z792 Long term (current) use of antibiotics: Secondary | ICD-10-CM | POA: Diagnosis not present

## 2015-02-02 DIAGNOSIS — F419 Anxiety disorder, unspecified: Secondary | ICD-10-CM | POA: Insufficient documentation

## 2015-02-02 DIAGNOSIS — Z7952 Long term (current) use of systemic steroids: Secondary | ICD-10-CM | POA: Diagnosis not present

## 2015-02-02 DIAGNOSIS — J449 Chronic obstructive pulmonary disease, unspecified: Secondary | ICD-10-CM | POA: Diagnosis not present

## 2015-02-02 DIAGNOSIS — Z79899 Other long term (current) drug therapy: Secondary | ICD-10-CM | POA: Diagnosis not present

## 2015-02-02 DIAGNOSIS — K219 Gastro-esophageal reflux disease without esophagitis: Secondary | ICD-10-CM | POA: Diagnosis not present

## 2015-02-02 DIAGNOSIS — I10 Essential (primary) hypertension: Secondary | ICD-10-CM | POA: Diagnosis not present

## 2015-02-02 DIAGNOSIS — Z7951 Long term (current) use of inhaled steroids: Secondary | ICD-10-CM | POA: Diagnosis not present

## 2015-02-02 DIAGNOSIS — M79602 Pain in left arm: Secondary | ICD-10-CM | POA: Diagnosis not present

## 2015-02-02 DIAGNOSIS — Z720 Tobacco use: Secondary | ICD-10-CM | POA: Diagnosis not present

## 2015-02-02 LAB — CBC WITH DIFFERENTIAL/PLATELET
BASOS ABS: 0.1 10*3/uL (ref 0.0–0.1)
Basophils Relative: 1 % (ref 0–1)
EOS ABS: 0.3 10*3/uL (ref 0.0–0.7)
EOS PCT: 3 % (ref 0–5)
HCT: 39.9 % (ref 39.0–52.0)
Hemoglobin: 14.1 g/dL (ref 13.0–17.0)
LYMPHS ABS: 1.7 10*3/uL (ref 0.7–4.0)
LYMPHS PCT: 18 % (ref 12–46)
MCH: 29.6 pg (ref 26.0–34.0)
MCHC: 35.3 g/dL (ref 30.0–36.0)
MCV: 83.6 fL (ref 78.0–100.0)
Monocytes Absolute: 0.9 10*3/uL (ref 0.1–1.0)
Monocytes Relative: 9 % (ref 3–12)
NEUTROS PCT: 69 % (ref 43–77)
Neutro Abs: 6.9 10*3/uL (ref 1.7–7.7)
PLATELETS: 184 10*3/uL (ref 150–400)
RBC: 4.77 MIL/uL (ref 4.22–5.81)
RDW: 12.4 % (ref 11.5–15.5)
WBC: 9.8 10*3/uL (ref 4.0–10.5)

## 2015-02-02 LAB — TROPONIN I

## 2015-02-02 LAB — BASIC METABOLIC PANEL
Anion gap: 9 (ref 5–15)
BUN: 17 mg/dL (ref 6–20)
CO2: 28 mmol/L (ref 22–32)
CREATININE: 1.01 mg/dL (ref 0.61–1.24)
Calcium: 9.3 mg/dL (ref 8.9–10.3)
Chloride: 101 mmol/L (ref 101–111)
Glucose, Bld: 105 mg/dL — ABNORMAL HIGH (ref 65–99)
Potassium: 3.4 mmol/L — ABNORMAL LOW (ref 3.5–5.1)
Sodium: 138 mmol/L (ref 135–145)

## 2015-02-02 NOTE — ED Provider Notes (Addendum)
CSN: [id number]     Arrival date & time 02/02/15  1927 History  This chart was scribed for Rolan Bucco, MD by Marica Otter, ED Scribe. This patient was seen in room MH10/MH10 and the patient's care was started at 8:03 PM.   Chief Complaint  Patient presents with  . Hypertension   The history is provided by the patient. No language interpreter was used.   PCP: Sid Falcon, MD HPI Comments: Randall Swanson is a 39 y.o. male, accompanied by his wife, with PMHx noted below including HTN, COPD and daily tobacco use, who presents to the Emergency Department with multiple complaints.   First, pt complains of intermittent, aching left sided chest pain with associated intermittent throbbing of left arm onset three weeks ago. Pt specifies that each episode lasts approximately 2 minutes. Pt states he is unable to reproduce the pain, nothing makes the pain better or worse, and the episodes are sporadic. Pt denies associated SOB with episodes of chest pain. Pt denies any current chest pain and states it feels like a "gurgle in there."  Pt reports he had a stress test completed two years ago. Pt reports his grandfather died of a heart attack but denies his mother, father, or siblings have any Hx of heart problems.   Second, pt complains of episode of anxiety onset today when he was driving home from work. 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 Pt states he felt at baseline until he began to drive home from work. Pt repeatedly states "I never felt like this before." Pt notes his life is very stressful at baseline with increased stress lately because he is in the process of buying a house and has a child with special needs.   Pt also complains of intermittent elevated BP onset this past Wednesday when pt had a reading of 154/92. Pt reports he had an elevated BP reading today after returning home from work.  Pt denies any new dizziness (pt notes that he has episodes of dizziness at  baseline), speech difficulty, vision problems, balance problems (pt notes balance problems at baseline and no new changes). Pt notes his BP meds were recently changed whereby HCTZ  was added.      Past Medical History  Diagnosis Date  . Hypertension   . COPD (chronic obstructive pulmonary disease)   . Acid reflux    Past Surgical History  Procedure Laterality Date  . Hernia repair     Family History  Problem Relation Age of Onset  . Hypertension Mother   . Hypertension Father    History  Substance Use Topics  . Smoking status: Current Every Day Smoker  . Smokeless tobacco: Not on file  . Alcohol Use: No    Review of Systems  Constitutional: Negative for fever, chills, diaphoresis and fatigue.  HENT: Negative for congestion, rhinorrhea and sneezing.   Eyes: Negative.  Negative for visual disturbance.  Respiratory: Negative for cough, chest tightness and shortness of breath.   Cardiovascular: Positive for chest pain. Negative for leg swelling.  Gastrointestinal: Negative for nausea, vomiting, abdominal pain, diarrhea and blood in stool.  Genitourinary: Negative for frequency, hematuria, flank pain and difficulty urinating.  Musculoskeletal: Negative for back pain and arthralgias.       Left arm pain  Skin: Negative for rash.  Neurological: Negative for dizziness (at baseline no new Sx), speech difficulty, weakness, numbness and headaches.  Psychiatric/Behavioral: The patient is nervous/anxious.    Allergies  Review  of patient's allergies indicates no known allergies.  Home Medications   Prior to Admission medications   Medication Sig Start Date End Date Taking? Authorizing Provider  amoxicillin (AMOXIL) 875 MG tablet Take 1 tablet (875 mg total) by mouth 2 (two) times daily. (Rx void after 05/27/13) 06/18/13   Lattie Haw, MD  azithromycin (ZITHROMAX Z-PAK) 250 MG tablet Take 2 tablets on day one, then 1 tablet daily on days 2 through 5 10/30/13   Lajean Manes, MD   budesonide-formoterol [facility]) 160-4.5 MCG/ACT inhaler Inhale 2 puffs into the lungs 2 (two) times daily.   Yes Historical Provider, MD  Cetirizine HCl 10 MG CAPS Take 1 capsule (10 mg total) by mouth daily. 07/06/12   Floydene Flock, MD  fluticasone (FLONASE) 50 MCG/ACT nasal spray Place 2 sprays into the nose daily. 07/06/12   Floydene Flock, MD  lisinopril (PRINIVIL,ZESTRIL) 10 MG tablet Take 10 mg by mouth daily.      Historical Provider, MD  losartan-hydrochlorothiazide (HYZAAR) 100-12.5 MG per tablet Take 1 tablet by mouth daily.   Yes Historical Provider, MD  omeprazole (PRILOSEC) 20 MG capsule Take 1 capsule (20 mg total) by mouth daily. 08/06/11 08/05/12  Adlih Moreno-Coll, MD  predniSONE (STERAPRED UNI-PAK) 10 MG tablet Take as directed for 6 days.--Take 6 on day 1, 5 on day 2, 4 on day 3, then 3 tablets on day 4, then 2 tablets on day 5, then 1 on day 6. 10/30/13   Lajean Manes, MD  triamcinolone cream (KENALOG) 0.1 % Apply 1 application topically 2 (two) times daily. 06/18/13   Lattie Haw, MD  UNKNOWN TO PATIENT     Historical Provider, MD   Triage Vitals: BP 170/90 mmHg  Pulse 93  Temp(Src) 98.3 F (36.8 C) (Oral)  Resp 18  Ht 6\' 2"  (1.88 m)  Wt 215 lb (97.523 kg)  BMI 27.59 kg/m2  SpO2 99% Physical Exam  Constitutional: He is oriented to person, place, and time. He appears well-developed and well-nourished.  HENT:  Head: Normocephalic and atraumatic.  Eyes: Pupils are equal, round, and reactive to light.  Neck: Normal range of motion. Neck supple.  Cardiovascular: Normal rate, regular rhythm and normal heart sounds.   Pulmonary/Chest: Effort normal and breath sounds normal. No respiratory distress. He has no wheezes. He has no rales. He exhibits no tenderness.  Abdominal: Soft. Bowel sounds are normal. There is no tenderness. There is no rebound and no guarding.  Musculoskeletal: Normal range of motion. He exhibits no edema.  Lymphadenopathy:    He has no cervical  adenopathy.  Neurological: He is alert and oriented to person, place, and time. He has normal strength. No cranial nerve deficit or sensory deficit. GCS eye subscore is 4. GCS verbal subscore is 5. GCS motor subscore is 6.  Finger and nose intact and no pronator drift  Skin: Skin is warm and dry. No rash noted.  Psychiatric: He has a normal mood and affect.    ED Course  Procedures (including critical care time) DIAGNOSTIC STUDIES: Oxygen Saturation is 99% on RA, nl by my interpretation.    COORDINATION OF CARE: 8:16 PM-Discussed treatment plan which includes EKG results, labs, f/u with PCP to get a stress test with pt at bedside and pt agreed to plan.   Results for orders placed or performed during the hospital encounter of 02/02/15  Basic metabolic panel  Result Value Ref Range   Sodium 138 135 - 145 mmol/L   Potassium 3.4 (  L) 3.5 - 5.1 mmol/L   Chloride 101 101 - 111 mmol/L   CO2 28 22 - 32 mmol/L   Glucose, Bld 105 (H) 65 - 99 mg/dL   BUN 17 6 - 20 mg/dL   Creatinine, Ser 1.611.01 0.61 - 1.24 mg/dL   Calcium 9.3 8.9 - 09.610.3 mg/dL   GFR calc non Af Amer >60 >60 mL/min   GFR calc Af Amer >60 >60 mL/min   Anion gap 9 5 - 15  CBC with Differential  Result Value Ref Range   WBC 9.8 4.0 - 10.5 K/uL   RBC 4.77 4.22 - 5.81 MIL/uL   Hemoglobin 14.1 13.0 - 17.0 g/dL   HCT 04.539.9 40.939.0 - 81.152.0 %   MCV 83.6 78.0 - 100.0 fL   MCH 29.6 26.0 - 34.0 pg   MCHC 35.3 30.0 - 36.0 g/dL   RDW 91.412.4 78.211.5 - 95.615.5 %   Platelets 184 150 - 400 K/uL   Neutrophils Relative % 69 43 - 77 %   Neutro Abs 6.9 1.7 - 7.7 K/uL   Lymphocytes Relative 18 12 - 46 %   Lymphs Abs 1.7 0.7 - 4.0 K/uL   Monocytes Relative 9 3 - 12 %   Monocytes Absolute 0.9 0.1 - 1.0 K/uL   Eosinophils Relative 3 0 - 5 %   Eosinophils Absolute 0.3 0.0 - 0.7 K/uL   Basophils Relative 1 0 - 1 %   Basophils Absolute 0.1 0.0 - 0.1 K/uL  Troponin I  Result Value Ref Range   Troponin I <0.03 <0.031 ng/mL   Dg Chest 2 View  02/02/2015    CLINICAL DATA:  39 year old male with chest pain  EXAM: CHEST  2 VIEW  COMPARISON:  None.  FINDINGS: The heart size and mediastinal contours are within normal limits. Both lungs are clear. The visualized skeletal structures are unremarkable.  IMPRESSION: No active cardiopulmonary disease.   Electronically Signed   By: Elgie CollardArash  Radparvar M.D.   On: 02/02/2015 21:07     ED ECG REPORT   Date: 02/02/2015  Rate: 87  Rhythm: normal sinus rhythm  QRS Axis: normal  Intervals: normal  ST/T Wave abnormalities: normal  Conduction Disutrbances:none  Narrative Interpretation:   Old EKG Reviewed: none available  I have personally reviewed the EKG tracing and agree with the computerized printout as noted.    MDM   Final diagnoses:  Essential hypertension  Chest pain, unspecified chest pain type    Patient presents with elevated blood pressure, anxiety and intermittent chest pain. His chest pain is nonexertional. It's not pleuritic. It does not sound typical for acute coronary syndrome. It only lasts 2 minutes or less at a time. He has no other associated symptoms. His EKG does not show any ischemic changes. His troponin is negative. He has a low heart score of 2. His blood pressure was elevated on arrival but has since improved to 145/81. He has no neurologic deficits or evidence of a hypertensive emergency. He was discharged home in good condition. He was encouraged to have follow-up this week with his primary care physician. He was advised to return here if he has any worsening symptoms.  I personally performed the services described in this documentation, which was scribed in my presence.  The recorded information has been reviewed and considered.    Rolan BuccoMelanie Burnell Matlin, MD 02/02/15 [id number]  Rolan BuccoMelanie Roxine Whittinghill, MD 02/02/15 [phone]

## 2015-02-02 NOTE — ED Notes (Signed)
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

## 2015-02-02 NOTE — ED Notes (Addendum)
H/o htn, takes BP meds, recent addition to meds, has a PCP, here for recent high BP readings, also mentions, light headed (have been for a while), "feeling anxious, "whooshed", flushed, rapid HR, also mentions have had recent "L arm hurting, and R face tingling", "just had a physical with EKG". Again mentions anxious and scared. Denies pain at this time. Describes sx as episodic. Readings at home 188/101 and then 175/97.

## 2015-02-02 NOTE — ED Notes (Signed)
MD at bedside. 

## 2015-02-02 NOTE — ED Notes (Signed)
Patient placed on cardiac monitor.

## 2015-02-02 NOTE — Discharge Instructions (Signed)
Chest Pain (Nonspecific) °It is often hard to give a specific diagnosis for the cause of chest pain. There is always a chance that your pain could be related to something serious, such as a heart attack or a blood clot in the lungs. You need to follow up with your health care provider for further evaluation. °CAUSES  °· Heartburn. °· Pneumonia or bronchitis. °· Anxiety or stress. °· Inflammation around your heart (pericarditis) or lung (pleuritis or pleurisy). °· A blood clot in the lung. °· A collapsed lung (pneumothorax). It can develop suddenly on its own (spontaneous pneumothorax) or from trauma to the chest. °· Shingles infection (herpes zoster virus). °The chest wall is composed of bones, muscles, and cartilage. Any of these can be the source of the pain. °· The bones can be bruised by injury. °· The muscles or cartilage can be strained by coughing or overwork. °· The cartilage can be affected by inflammation and become sore (costochondritis). °DIAGNOSIS  °Lab tests or other studies may be needed to find the cause of your pain. Your health care provider may have you take a test called an ambulatory electrocardiogram (ECG). An ECG records your heartbeat patterns over a 24-hour period. You may also have other tests, such as: °· Transthoracic echocardiogram (TTE). During echocardiography, sound waves are used to evaluate how blood flows through your heart. °· Transesophageal echocardiogram (TEE). °· Cardiac monitoring. This allows your health care provider to monitor your heart rate and rhythm in real time. °· Holter monitor. This is a portable device that records your heartbeat and can help diagnose heart arrhythmias. It allows your health care provider to track your heart activity for several days, if needed. °· Stress tests by exercise or by giving medicine that makes the heart beat faster. °TREATMENT  °· Treatment depends on what may be causing your chest pain. Treatment may include: °· Acid blockers for  heartburn. °· Anti-inflammatory medicine. °· Pain medicine for inflammatory conditions. °· Antibiotics if an infection is present. °· You may be advised to change lifestyle habits. This includes stopping smoking and avoiding alcohol, caffeine, and chocolate. °· You may be advised to keep your head raised (elevated) when sleeping. This reduces the chance of acid going backward from your stomach into your esophagus. °Most of the time, nonspecific chest pain will improve within 2-3 days with rest and mild pain medicine.  °HOME CARE INSTRUCTIONS  °· If antibiotics were prescribed, take them as directed. Finish them even if you start to feel better. °· For the next few days, avoid physical activities that bring on chest pain. Continue physical activities as directed. °· Do not use any tobacco products, including cigarettes, chewing tobacco, or electronic cigarettes. °· Avoid drinking alcohol. °· Only take medicine as directed by your health care provider. °· Follow your health care provider's suggestions for further testing if your chest pain does not go away. °· Keep any follow-up appointments you made. If you do not go to an appointment, you could develop lasting (chronic) problems with pain. If there is any problem keeping an appointment, call to reschedule. °SEEK MEDICAL CARE IF:  °· Your chest pain does not go away, even after treatment. °· You have a rash with blisters on your chest. °· You have a fever. °SEEK IMMEDIATE MEDICAL CARE IF:  °· You have increased chest pain or pain that spreads to your arm, neck, jaw, back, or abdomen. °· You have shortness of breath. °· You have an increasing cough, or you cough   up blood. °· You have severe back or abdominal pain. °· You feel nauseous or vomit. °· You have severe weakness. °· You faint. °· You have chills. °This is an emergency. Do not wait to see if the pain will go away. Get medical help at once. Call your local emergency services (911 in [place] Do not drive  yourself to the hospital. °MAKE SURE YOU:  °· Understand these instructions. °· Will watch your condition. °· Will get help right away if you are not doing well or get worse. °Document Released: 04/15/2005 Document Revised: 07/11/2013 Document Reviewed: 02/09/2008 °ExitCare® Patient Information ©2015 [employer]. This information is not intended to replace advice given to you by your health care provider. Make sure you discuss any questions you have with your health care provider. ° °Hypertension °Hypertension, commonly called high blood pressure, is when the force of blood pumping through your arteries is too strong. Your arteries are the blood vessels that carry blood from your heart throughout your body. A blood pressure reading consists of a higher number over a lower number, such as 110/72. The higher number (systolic) is the pressure inside your arteries when your heart pumps. The lower number (diastolic) is the pressure inside your arteries when your heart relaxes. Ideally you want your blood pressure below 120/80. °Hypertension forces your heart to work harder to pump blood. Your arteries may become narrow or stiff. Having hypertension puts you at risk for heart disease, stroke, and other problems.  °RISK FACTORS °Some risk factors for high blood pressure are controllable. Others are not.  °Risk factors you cannot control include:  °· Race. You may be at higher risk if you are African American. °· Age. Risk increases with age. °· Gender. Men are at higher risk than women before age 45 years. After age 65, women are at higher risk than men. °Risk factors you can control include: °· Not getting enough exercise or physical activity. °· Being overweight. °· Getting too much fat, sugar, calories, or salt in your diet. °· Drinking too much alcohol. °SIGNS AND SYMPTOMS °Hypertension does not usually cause signs or symptoms. Extremely high blood pressure (hypertensive crisis) may cause headache, anxiety, shortness  of breath, and nosebleed. °DIAGNOSIS  °To check if you have hypertension, your health care provider will measure your blood pressure while you are seated, with your arm held at the level of your heart. It should be measured at least twice using the same arm. Certain conditions can cause a difference in blood pressure between your right and left arms. A blood pressure reading that is higher than normal on one occasion does not mean that you need treatment. If one blood pressure reading is high, ask your health care provider about having it checked again. °TREATMENT  °Treating high blood pressure includes making lifestyle changes and possibly taking medicine. Living a healthy lifestyle can help lower high blood pressure. You may need to change some of your habits. °Lifestyle changes may include: °· Following the DASH diet. This diet is high in fruits, vegetables, and whole grains. It is low in salt, red meat, and added sugars. °· Getting at least 2½ hours of brisk physical activity every week. °· Losing weight if necessary. °· Not smoking. °· Limiting alcoholic beverages. °· Learning ways to reduce stress. ° If lifestyle changes are not enough to get your blood pressure under control, your health care provider may prescribe medicine. You may need to take more than one. Work closely with your health care provider   to understand the risks and benefits. °HOME CARE INSTRUCTIONS °· Have your blood pressure rechecked as directed by your health care provider.   °· Take medicines only as directed by your health care provider. Follow the directions carefully. Blood pressure medicines must be taken as prescribed. The medicine does not work as well when you skip doses. Skipping doses also puts you at risk for problems.   °· Do not smoke.   °· Monitor your blood pressure at home as directed by your health care provider.  °SEEK MEDICAL CARE IF:  °· You think you are having a reaction to medicines taken. °· You have recurrent  headaches or feel dizzy. °· You have swelling in your ankles. °· You have trouble with your vision. °SEEK IMMEDIATE MEDICAL CARE IF: °· You develop a severe headache or confusion. °· You have unusual weakness, numbness, or feel faint. °· You have severe chest or abdominal pain. °· You vomit repeatedly. °· You have trouble breathing. °MAKE SURE YOU:  °· Understand these instructions. °· Will watch your condition. °· Will get help right away if you are not doing well or get worse. °Document Released: 07/06/2005 Document Revised: 11/20/2013 Document Reviewed: 04/28/2013 °ExitCare® Patient Information ©2015 [employer]. This information is not intended to replace advice given to you by your health care provider. Make sure you discuss any questions you have with your health care provider. ° °

## 2015-10-18 DIAGNOSIS — Z23 Encounter for immunization: Secondary | ICD-10-CM | POA: Diagnosis not present

## 2015-10-18 DIAGNOSIS — K219 Gastro-esophageal reflux disease without esophagitis: Secondary | ICD-10-CM | POA: Diagnosis not present

## 2015-10-18 DIAGNOSIS — I10 Essential (primary) hypertension: Secondary | ICD-10-CM | POA: Diagnosis not present

## 2015-10-18 MED FILL — VALSARTAN 320 MG TABLET: 320 | 90 days supply | Qty: 90 | Fill #0

## 2015-10-18 MED FILL — ESCITALOPRAM 20 MG TABLET: 20 | 90 days supply | Qty: 90 | Fill #0

## 2015-10-18 MED FILL — HYDROCHLOROTHIAZIDE 25 MG T: 25 | 90 days supply | Qty: 90 | Fill #0

## 2015-10-18 MED FILL — SYMBICORT 160-4.5 MCG INH: 160-4.5 | 30 days supply | Qty: 10 | Fill #0

## 2015-10-18 MED FILL — ALPRAZolam 0.5 MG TABS: 0.5 | 10 days supply | Qty: 30 | Fill #0

## 2015-10-18 MED FILL — PANTOPRAZOLE SOD DR 20 MG T: 20 | 90 days supply | Qty: 90 | Fill #0

## 2016-01-17 MED FILL — ESCITALOPRAM 20 MG TABLET: 20 | 90 days supply | Qty: 90 | Fill #1

## 2016-01-17 MED FILL — VALSARTAN 320 MG TABLET: 320 | 90 days supply | Qty: 90 | Fill #1

## 2016-01-17 MED FILL — PANTOPRAZOLE SOD DR 20 MG T: 20 | 90 days supply | Qty: 90 | Fill #1

## 2016-01-17 MED FILL — HYDROCHLOROTHIAZIDE 25 MG T: 25 | 90 days supply | Qty: 90 | Fill #1

## 2016-04-20 DIAGNOSIS — J4520 Mild intermittent asthma, uncomplicated: Secondary | ICD-10-CM | POA: Diagnosis not present

## 2016-04-20 DIAGNOSIS — I10 Essential (primary) hypertension: Secondary | ICD-10-CM | POA: Diagnosis not present

## 2016-04-20 DIAGNOSIS — F419 Anxiety disorder, unspecified: Secondary | ICD-10-CM | POA: Diagnosis not present

## 2016-04-20 DIAGNOSIS — K219 Gastro-esophageal reflux disease without esophagitis: Secondary | ICD-10-CM | POA: Diagnosis not present

## 2016-04-20 MED FILL — VALSARTAN 320 MG TABLET: 320 | 90 days supply | Qty: 90 | Fill #0

## 2016-04-20 MED FILL — ESCITALOPRAM 20 MG TABLET: 20 | 90 days supply | Qty: 90 | Fill #0

## 2016-04-20 MED FILL — HYDROCHLOROTHIAZIDE 25 MG T: 25 | 90 days supply | Qty: 90 | Fill #0

## 2016-04-20 MED FILL — PANTOPRAZOLE SOD DR 20 MG T: 20 | 90 days supply | Qty: 90 | Fill #0

## 2016-07-10 DIAGNOSIS — B9789 Other viral agents as the cause of diseases classified elsewhere: Secondary | ICD-10-CM | POA: Diagnosis not present

## 2016-07-10 DIAGNOSIS — J029 Acute pharyngitis, unspecified: Secondary | ICD-10-CM | POA: Diagnosis not present

## 2016-07-10 DIAGNOSIS — J069 Acute upper respiratory infection, unspecified: Secondary | ICD-10-CM | POA: Diagnosis not present

## 2016-07-17 MED FILL — HYDROCHLOROTHIAZIDE 25 MG T: 25 | 90 days supply | Qty: 90 | Fill #1

## 2016-07-17 MED FILL — ESCITALOPRAM 20 MG TABLET: 20 | 90 days supply | Qty: 90 | Fill #1

## 2016-07-17 MED FILL — VALSARTAN 320 MG TABLET: 320 | 90 days supply | Qty: 90 | Fill #1

## 2016-07-17 MED FILL — PANTOPRAZOLE SOD DR 20 MG T: 20 | 90 days supply | Qty: 90 | Fill #1

## 2016-07-31 DIAGNOSIS — R6889 Other general symptoms and signs: Secondary | ICD-10-CM | POA: Diagnosis not present

## 2016-07-31 DIAGNOSIS — J4521 Mild intermittent asthma with (acute) exacerbation: Secondary | ICD-10-CM | POA: Diagnosis not present

## 2016-07-31 DIAGNOSIS — R05 Cough: Secondary | ICD-10-CM | POA: Diagnosis not present

## 2016-07-31 MED FILL — OSELTAMIVIR PHOS 75 MG CAP: 75 | 5 days supply | Qty: 10 | Fill #0

## 2016-07-31 MED FILL — VENTOLIN HFA 90 MCG INHALER: 108 (90 BAS | 25 days supply | Qty: 18 | Fill #0

## 2016-07-31 MED FILL — predniSONE 10 MG TABS: 10 | 12 days supply | Qty: 30 | Fill #0

## 2016-11-25 NOTE — Progress Notes (Addendum)
[facility] [address], [address] [place], [place] [address] [phone] [phone]  Date:  11/26/2016   Name:  Randall Swanson   DOB:  [birth year]   MRN:  [id number]  PCP:  Pearline Cablesopland, Montine Hight C, MD    Chief Complaint: Establish Care (Pt here to est care. Will need refills today. Pt would like to discuss possible carpal tunnel and sleep apnea. Pt states that he was dx with mild COPD a few years ago. )   History of Present Illness:  Randall Swanson is a 41 y.o. very pleasant male patient who presents with the following:  Here today as a new patient to our office.  History of hyperlipidemia and HTN, GAD, smoker Most recent labs in epic are 41 years old  He has been a [facility] pt most recently but would like to change to getting his care here as his wife works here as a Associate Professorpharmacy tech.    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 Their middle daughter has CP, oldest child is 627 yo and has mild ADD.    States that he was dx with COPD in the past from x-ray, but his spirometry test was negative.  He is working on quitting smoking and plans to quit soon. He is down to 5-6 cigs a day. He does use albuterol on occasion for wheezing.  He also has allergies and uses flonase  His wife does notice that he snores at night and they are interested in a sleep apnea test for him. He does feel tired, but there could be other reasons for this as well- his work schedule and young children!   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   This will bother him more at night.  He does wear a brace some of the time at night.   He is right handed  History of anxiety, he uses lexapro and xanax prn.  He would like to have this refilled- he uses this on occasion.   Notes that he rarely needs the xanax  NCCSR: no entries  He is on valsartan and HCTZ for HTN  He is fasting  this am  His last tetanus shot was approx 7 years ago.    Unsure if td or tdap This would have been with Dr. Lorinda CreedStickland in [place]    BP Readings from Last 3 Encounters:  11/26/16 118/80  02/02/15 135/77  10/30/13 146/86     Patient Active Problem List   Diagnosis Date Noted  . ESOPHAGEAL REFLUX 04/28/2011  . HYPERLIPIDEMIA 04/02/2011  . HYPERTENSION 04/02/2011    Past Medical History:  Diagnosis Date  . Acid reflux   . COPD (chronic obstructive pulmonary disease)   . Hypertension     Past Surgical History:  Procedure Laterality Date  . HERNIA REPAIR      Social History  Substance Use Topics  . Smoking status: Current Every Day Smoker  . Smokeless tobacco: Not on file  . Alcohol use No    Family History  Problem Relation Age of Onset  . Hypertension Mother   . Hypertension Father     No Known Allergies  Medication list has been reviewed and updated.  No current outpatient prescriptions on file prior to visit.   No current facility-administered medications on file prior to  visit.     Review of Systems:  As per HPI- otherwise negative.  No fever, chills, CP, SOB, nausea, vomiting, diarrhea, rash    Physical Examination: Vitals:   11/26/16 0842  BP: 118/80  Pulse: 63  Temp: 97.9 F (36.6 C)   Vitals:   11/26/16 0842  Weight: 234 lb 9.6 oz (106.4 kg)  Height: 6\' 1"  (1.854 m)   Body mass index is 30.95 kg/m. Ideal Body Weight: Weight in (lb) to have BMI = 25: 189.1  GEN: WDWN, NAD, Non-toxic, A & O x 3, overweight, does have a larger neck for his size when supine.  Would suspect OSA HEENT: Atraumatic, Normocephalic. Neck supple. No masses, No LAD.  Bilateral TM wnl, oropharynx normal.  PEERL,EOMI.   Ears and Nose: No external deformity. CV: RRR, No M/G/R. No JVD. No thrill. No extra heart sounds. PULM: CTA B, no wheezes, crackles, rhonchi. No retractions. No resp. distress. No accessory muscle use. ABD: S, NT, ND. No rebound. No HSM. EXTR: No  c/c/e NEURO Normal gait.  PSYCH: Normally interactive. Conversant. Not depressed or anxious appearing.  Calm demeanor.    Assessment and Plan: Essential hypertension - Plan: hydrochlorothiazide (HYDRODIURIL) 25 MG tablet, valsartan (DIOVAN) 320 MG tablet  GAD (generalized anxiety disorder) - Plan: escitalopram (LEXAPRO) 20 MG tablet, ALPRAZolam (XANAX) 0.5 MG tablet  COPD, mild (HCC) - Plan: albuterol (PROVENTIL HFA;VENTOLIN HFA) 108 (90 Base) MCG/ACT inhaler  Snoring - Plan: Ambulatory referral to Neurology  Carpal tunnel syndrome of right wrist - Plan: Ambulatory referral to Hand Surgery  Allergic rhinitis, unspecified seasonality, unspecified trigger - Plan: fluticasone (FLONASE) 50 MCG/ACT nasal spray  Screening for diabetes mellitus - Plan: Comprehensive metabolic panel, Hemoglobin A1c  Screening for hyperlipidemia - Plan: Lipid panel  Screening for deficiency anemia - Plan: CBC  Here today to establish care and go over a few concerns Refilled medications Encouraged him to continue to work on quitting smoking Referral for sleep eval and CTS eval Have requested info on his tetanus status from his last PCP   Signed Abbe Amsterdam, MD  Received his labs and gave him a call, 5/11.  He is ok with going back on cholesterol medication.  Letter to pt with details, rx for lovastatin 20   Results for orders placed or performed in visit on 11/26/16  CBC  Result Value Ref Range   WBC 5.9 4.0 - 10.5 K/uL   RBC 5.13 4.22 - 5.81 Mil/uL   Platelets 217.0 150.0 - 400.0 K/uL   Hemoglobin 15.3 13.0 - 17.0 g/dL   HCT 16.1 09.6 - 04.5 %   MCV 85.8 78.0 - 100.0 fl   MCHC 34.8 30.0 - 36.0 g/dL   RDW 40.9 81.1 - 91.4 %  Comprehensive metabolic panel  Result Value Ref Range   Sodium 138 135 - 145 mEq/L   Potassium 4.0 3.5 - 5.1 mEq/L   Chloride 104 96 - 112 mEq/L   CO2 28 19 - 32 mEq/L   Glucose, Bld 107 (H) 70 - 99 mg/dL   BUN 19 6 - 23 mg/dL   Creatinine, Ser 7.82 0.40 - 1.50  mg/dL   Total Bilirubin 0.5 0.2 - 1.2 mg/dL   Alkaline Phosphatase 45 39 - 117 U/L   AST 26 0 - 37 U/L   ALT 38 0 - 53 U/L   Total Protein 6.9 6.0 - 8.3 g/dL   Albumin 4.4 3.5 - 5.2 g/dL   Calcium 9.3 8.4 - 95.6 mg/dL  GFR 125.00 >60.00 mL/min  Lipid panel  Result Value Ref Range   Cholesterol 188 0 - 200 mg/dL   Triglycerides 161.0 (H) 0.0 - 149.0 mg/dL   HDL 96.04 (L) >54.09 mg/dL   VLDL 81.1 (H) 0.0 - 91.4 mg/dL   Total CHOL/HDL Ratio 6    NonHDL 155.25   Hemoglobin A1c  Result Value Ref Range   Hgb A1c MFr Bld 6.0 4.6 - 6.5 %  LDL cholesterol, direct  Result Value Ref Range   Direct LDL 113.0 mg/dL

## 2016-11-26 ENCOUNTER — Ambulatory Visit (INDEPENDENT_AMBULATORY_CARE_PROVIDER_SITE_OTHER): Payer: 59 | Admitting: Family Medicine

## 2016-11-26 ENCOUNTER — Encounter: Payer: Self-pay | Admitting: Family Medicine

## 2016-11-26 VITALS — BP 118/80 | HR 63 | Temp 97.9°F | Ht 73.0 in | Wt 234.6 lb

## 2016-11-26 DIAGNOSIS — Z13220 Encounter for screening for lipoid disorders: Secondary | ICD-10-CM | POA: Diagnosis not present

## 2016-11-26 DIAGNOSIS — R0683 Snoring: Secondary | ICD-10-CM | POA: Diagnosis not present

## 2016-11-26 DIAGNOSIS — Z130 Encounter for screening for diseases of the blood and blood-forming organs and certain disorders involving the immune mechanism: Secondary | ICD-10-CM | POA: Diagnosis not present

## 2016-11-26 DIAGNOSIS — G5601 Carpal tunnel syndrome, right upper limb: Secondary | ICD-10-CM | POA: Diagnosis not present

## 2016-11-26 DIAGNOSIS — F411 Generalized anxiety disorder: Secondary | ICD-10-CM | POA: Insufficient documentation

## 2016-11-26 DIAGNOSIS — J449 Chronic obstructive pulmonary disease, unspecified: Secondary | ICD-10-CM | POA: Diagnosis not present

## 2016-11-26 DIAGNOSIS — F17200 Nicotine dependence, unspecified, uncomplicated: Secondary | ICD-10-CM

## 2016-11-26 DIAGNOSIS — E785 Hyperlipidemia, unspecified: Secondary | ICD-10-CM

## 2016-11-26 DIAGNOSIS — Z131 Encounter for screening for diabetes mellitus: Secondary | ICD-10-CM

## 2016-11-26 DIAGNOSIS — J309 Allergic rhinitis, unspecified: Secondary | ICD-10-CM | POA: Diagnosis not present

## 2016-11-26 DIAGNOSIS — I10 Essential (primary) hypertension: Secondary | ICD-10-CM

## 2016-11-26 LAB — CBC
HEMATOCRIT: 44.1 % (ref 39.0–52.0)
HEMOGLOBIN: 15.3 g/dL (ref 13.0–17.0)
MCHC: 34.8 g/dL (ref 30.0–36.0)
MCV: 85.8 fl (ref 78.0–100.0)
PLATELETS: 217 10*3/uL (ref 150.0–400.0)
RBC: 5.13 Mil/uL (ref 4.22–5.81)
RDW: 12.9 % (ref 11.5–15.5)
WBC: 5.9 10*3/uL (ref 4.0–10.5)

## 2016-11-26 LAB — COMPREHENSIVE METABOLIC PANEL
ALBUMIN: 4.4 g/dL (ref 3.5–5.2)
ALK PHOS: 45 U/L (ref 39–117)
ALT: 38 U/L (ref 0–53)
AST: 26 U/L (ref 0–37)
BUN: 19 mg/dL (ref 6–23)
CALCIUM: 9.3 mg/dL (ref 8.4–10.5)
CHLORIDE: 104 meq/L (ref 96–112)
CO2: 28 mEq/L (ref 19–32)
Creatinine, Ser: 0.73 mg/dL (ref 0.40–1.50)
GFR: 125 mL/min (ref >60.00)
Glucose, Bld: 107 mg/dL — ABNORMAL HIGH (ref 70–99)
POTASSIUM: 4 meq/L (ref 3.5–5.1)
Sodium: 138 mEq/L (ref 135–145)
TOTAL PROTEIN: 6.9 g/dL (ref 6.0–8.3)
Total Bilirubin: 0.5 mg/dL (ref 0.2–1.2)

## 2016-11-26 LAB — LIPID PANEL
CHOL/HDL RATIO: 6
CHOLESTEROL: 188 mg/dL (ref 0–200)
HDL: 32.6 mg/dL — ABNORMAL LOW (ref >39.00)
NonHDL: 155.25
TRIGLYCERIDES: 346 mg/dL — AB (ref 0.0–149.0)
VLDL: 69.2 mg/dL — ABNORMAL HIGH (ref 0.0–40.0)

## 2016-11-26 LAB — HEMOGLOBIN A1C: Hgb A1c MFr Bld: 6 % (ref 4.6–6.5)

## 2016-11-26 LAB — LDL CHOLESTEROL, DIRECT: Direct LDL: 113 mg/dL

## 2016-11-26 MED ORDER — VALSARTAN 320 MG PO TABS: 320.0000 mg | ORAL_TABLET | Freq: Every day | ORAL | 3 refills | Status: DC

## 2016-11-26 MED ORDER — ALPRAZOLAM 0.5 MG PO TABS: ORAL_TABLET | ORAL | 1 refills | Status: DC

## 2016-11-26 MED ORDER — ESCITALOPRAM OXALATE 20 MG PO TABS: 20.0000 mg | ORAL_TABLET | Freq: Every day | ORAL | 3 refills | Status: DC

## 2016-11-26 MED ORDER — FLUTICASONE PROPIONATE 50 MCG/ACT NA SUSP: 1.0000 | Freq: Every day | NASAL | 11 refills | Status: DC

## 2016-11-26 MED ORDER — HYDROCHLOROTHIAZIDE 25 MG PO TABS: 25.0000 mg | ORAL_TABLET | Freq: Every day | ORAL | 3 refills | Status: DC

## 2016-11-26 MED ORDER — ALBUTEROL SULFATE HFA 108 (90 BASE) MCG/ACT IN AERS: 2.0000 | INHALATION_SPRAY | RESPIRATORY_TRACT | 9 refills | Status: DC | PRN

## 2016-11-26 MED FILL — FLUTICASONE PROP 50 MCG SPR: 50 | 60 days supply | Qty: 16 | Fill #0

## 2016-11-26 MED FILL — ESCITALOPRAM 20 MG TABLET: 20 | 90 days supply | Qty: 90 | Fill #0

## 2016-11-26 MED FILL — VENTOLIN HFA 90 MCG INHALER: 108 (90 BAS | 25 days supply | Qty: 18 | Fill #0

## 2016-11-26 MED FILL — ALPRAZolam 0.5 MG TABS: 0.5 | 60 days supply | Qty: 30 | Fill #0

## 2016-11-26 MED FILL — VALSARTAN 320 MG TABLET: 320 | 90 days supply | Qty: 90 | Fill #0

## 2016-11-26 MED FILL — HYDROCHLOROTHIAZIDE 25 MG T: 25 | 90 days supply | Qty: 90 | Fill #0

## 2016-11-26 NOTE — Patient Instructions (Addendum)
It was very nice to meet you today- take care and I will be in touch with your labs asap We will refer you to hand surgery to look at your wrist, and to have a sleep study at your convenience  Continue to work on quitting smoking!  Use the xanax sparingly as needed for anxiety Your BP looks fine today- continue current medications.    We have to fax a request to Dr. Drucie OpitzStrickland's office to find out what type of tetanus shot you had in the past- I will let you know when I find out

## 2016-11-27 DIAGNOSIS — E785 Hyperlipidemia, unspecified: Secondary | ICD-10-CM | POA: Insufficient documentation

## 2016-11-27 MED ORDER — LOVASTATIN 20 MG PO TABS: 20.0000 mg | ORAL_TABLET | Freq: Every day | ORAL | 3 refills | Status: DC

## 2016-11-27 MED FILL — LOVASTATIN 20 MG TABLET: 20 | 90 days supply | Qty: 90 | Fill #0

## 2016-11-27 NOTE — Addendum Note (Signed)
Addended by: Abbe AmsterdamOPLAND, Kazuma Elena C on: 11/27/2016 12:29 PM   Modules accepted: Orders

## 2016-12-28 MED FILL — HYDROCHLOROTHIAZIDE 25 MG T: 25 | 90 days supply | Qty: 90 | Fill #1

## 2017-01-25 ENCOUNTER — Ambulatory Visit (INDEPENDENT_AMBULATORY_CARE_PROVIDER_SITE_OTHER): Payer: 59 | Admitting: Sports Medicine

## 2017-01-25 ENCOUNTER — Ambulatory Visit (INDEPENDENT_AMBULATORY_CARE_PROVIDER_SITE_OTHER): Payer: 59

## 2017-01-25 ENCOUNTER — Encounter: Payer: Self-pay | Admitting: Sports Medicine

## 2017-01-25 DIAGNOSIS — M5136 Other intervertebral disc degeneration, lumbar region: Secondary | ICD-10-CM

## 2017-01-25 DIAGNOSIS — R200 Anesthesia of skin: Secondary | ICD-10-CM

## 2017-01-25 DIAGNOSIS — G5601 Carpal tunnel syndrome, right upper limb: Secondary | ICD-10-CM | POA: Insufficient documentation

## 2017-01-25 DIAGNOSIS — M545 Low back pain: Secondary | ICD-10-CM

## 2017-01-25 DIAGNOSIS — M51369 Other intervertebral disc degeneration, lumbar region without mention of lumbar back pain or lower extremity pain: Secondary | ICD-10-CM | POA: Insufficient documentation

## 2017-01-25 MED ORDER — MELOXICAM 15 MG PO TABS: ORAL_TABLET | ORAL | 3 refills | Status: DC

## 2017-01-25 MED ORDER — PREDNISONE 50 MG PO TABS: ORAL_TABLET | ORAL | 0 refills | Status: DC

## 2017-01-25 MED FILL — predniSONE 50 MG TABS: 50 | 5 days supply | Qty: 5 | Fill #0

## 2017-01-25 MED FILL — MELOXICAM 15 MG TABLET: 15 | 30 days supply | Qty: 30 | Fill #0

## 2017-01-25 NOTE — Progress Notes (Signed)
   Subjective:    [name] as a consultation for:  Dr. Shanda BumpsJessica Copland  CC: Back pain  HPI: For years this 41 year old male has had back pain on and off, worse with standing, better with sitting. Radiation down to the back of the left thigh without radiation past the knee, no numbness or tingling in the feet. No worsening with Valsalva. Moderate, persistent, no bowel or bladder dysfunction, saddle numbness, constitutional symptoms.  In addition he complains of right hand numbness and tingling, often times the first through the fourth fingers, but he also gets dorsal hand symptoms. He has been doing intermittent nighttime splinting on and off for months, it works after a few days and then he stops. He did have a nerve conduction study and EMG years ago that he was unable to complete.  Past medical history:  Negative.  See flowsheet/record as well for more information.  Surgical history: Negative.  See flowsheet/record as well for more information.  Family history: Negative.  See flowsheet/record as well for more information.  Social history: Negative.  See flowsheet/record as well for more information.  Allergies, and medications have been entered into the medical record, reviewed, and no changes needed.   Review of Systems: No headache, visual changes, nausea, vomiting, diarrhea, constipation, dizziness, abdominal pain, skin rash, fevers, chills, night sweats, weight loss, swollen lymph nodes, body aches, joint swelling, muscle aches, chest pain, shortness of breath, mood changes, visual or auditory hallucinations.   Objective:   General: Well Developed, well nourished, and in no acute distress.  Neuro/Psych: Alert and oriented x3, extra-ocular muscles intact, able to move all 4 extremities, sensation grossly intact. Skin: Warm and dry, no rashes noted.  Respiratory: Not using accessory muscles, speaking in full sentences, trachea midline.  Cardiovascular: Pulses palpable, no  extremity edema. Abdomen: Does not appear distended. Back Exam:  Inspection: Unremarkable  Motion: Flexion 45 deg, Extension 45 deg, Side Bending to 45 deg bilaterally,  Rotation to 45 deg bilaterally  SLR laying: Negative  XSLR laying: Negative  Palpable tenderness: None. FABER: negative. Sensory change: Gross sensation intact to all lumbar and sacral dermatomes.  Reflexes: 2+ at both patellar tendons, 2+ at achilles tendons, Babinski's downgoing.  Strength at foot  Plantar-flexion: 5/5 Dorsi-flexion: 5/5 Eversion: 5/5 Inversion: 5/5  Leg strength  Quad: 5/5 Hamstring: 5/5 Hip flexor: 5/5 Hip abductors: 5/5  Gait unremarkable.  Impression and Recommendations:   This case required medical decision making of moderate complexity.  Numbness of right hand Suspect CTS vs right cervical radiculitis in a C6 and C7 distribution. Didn't complete NCS/EMG years ago. Increase compliance with night splinting for 4 weeks, nerve conduction study if no better.  Lumbar degenerative disc disease Left sided radiating pain, XR, PT, prednisone, meloxicam. Suspect spinal stenosis related pain. Return in 4 weeks.

## 2017-01-25 NOTE — Assessment & Plan Note (Signed)
Suspect CTS vs right cervical radiculitis in a C6 and C7 distribution. Didn't complete NCS/EMG years ago. Increase compliance with night splinting for 4 weeks, nerve conduction study if no better.

## 2017-01-25 NOTE — Assessment & Plan Note (Signed)
Left sided radiating pain, XR, PT, prednisone, meloxicam. Suspect spinal stenosis related pain. Return in 4 weeks.

## 2017-02-03 ENCOUNTER — Ambulatory Visit: Payer: 59 | Attending: Sports Medicine | Admitting: Physical Therapy

## 2017-02-03 DIAGNOSIS — R293 Abnormal posture: Secondary | ICD-10-CM | POA: Diagnosis present

## 2017-02-03 DIAGNOSIS — G8929 Other chronic pain: Secondary | ICD-10-CM | POA: Insufficient documentation

## 2017-02-03 DIAGNOSIS — R29898 Other symptoms and signs involving the musculoskeletal system: Secondary | ICD-10-CM | POA: Diagnosis present

## 2017-02-03 DIAGNOSIS — M545 Low back pain: Secondary | ICD-10-CM | POA: Insufficient documentation

## 2017-02-03 NOTE — Therapy (Signed)
[facility] [address]  [place], [place], [address] Phone: [phone]   Fax:  [phone]  Physical Therapy Evaluation  Patient Details  Name: Randall Swanson MRN: [id number] Date of Birth: Oct 04, 1975 Referring Provider: Dr. Aundria Mems  Encounter Date: 02/03/2017      PT End of Session - 02/03/17 1729    Visit Number 1   Number of Visits 12   Date for PT Re-Evaluation 03/17/17   PT Start Time 1652   PT Stop Time 1733   PT Time Calculation (min) 41 min   Activity Tolerance Patient tolerated treatment well   Behavior During Therapy [facility] for tasks assessed/performed      Past Medical History:  Diagnosis Date  . Acid reflux   . COPD (chronic obstructive pulmonary disease) ([place])   . Hypertension     Past Surgical History:  Procedure Laterality Date  . HERNIA REPAIR      There were no vitals filed for this visit.       Subjective Assessment - 02/03/17 1654    Subjective Patient reporting long history of back pain - has gotten worse. Approx 2 weeks ago had numbness from L mid back to approx knee level. Better with sitting - feels like it is getting better. Back pain is lower and bilateral - "tolerable". On feet most of his day - bending and lifting for job duties. Patient is a Biomedical scientist (catering).    Pertinent History R hand numbness    Limitations Lifting;Standing;Walking   How long can you sit comfortably? no issue   How long can you stand comfortably? pain with bending and lifting   How long can you walk comfortably? couple hours   Diagnostic tests Xray - degenerative changes   Currently in Pain? Yes   Pain Score 2    Pain Location Back   Pain Orientation Left;Lower   Pain Descriptors / Indicators Aching;Sore;Numbness   Pain Type Chronic pain   Pain Radiating Towards L knee   Pain Onset More than a month ago   Pain Frequency Intermittent   Aggravating Factors  bending, lifitng   Pain  Relieving Factors nothing            [facility] PT Assessment - 02/03/17 1701      Assessment   Medical Diagnosis Lumbar degenerative disc disease   Referring Provider Dr. Aundria Mems   Onset Date/Surgical Date --  chronic; ~2 weeks ago increase in pain/numbness   Next MD Visit 02/22/17   Prior Therapy yes - not for this issue     Precautions   Precautions None     Restrictions   Weight Bearing Restrictions No     Balance Screen   Has the patient fallen in the past 6 months No   Has the patient had a decrease in activity level because of a fear of falling?  No   Is the patient reluctant to leave their home because of a fear of falling?  No     Home Environment   Living Environment Private residence   Living Arrangements Spouse/significant other   Type of [place]     Prior Function   Level of Independence Independent   Vocation Full time employment   Vocation Requirements chef   Leisure child care - has daughter with cerebral palsy     Cognition   Overall Cognitive Status Within Functional Limits for tasks assessed     Observation/Other Assessments  Focus on Therapeutic Outcomes (FOTO)  Lumbar Spine: 70 (30% limited, predicted 26% limited)     Sensation   Light Touch Appears Intact     Coordination   Gross Motor Movements are Fluid and Coordinated Yes     Posture/Postural Control   Posture/Postural Control Postural limitations   Postural Limitations Rounded Shoulders;Forward head     ROM / Strength   AROM / PROM / Strength AROM;Strength     AROM   AROM Assessment Site Lumbar   Lumbar Flexion fingertips to toes - stretching   Lumbar Extension 50% limited   Lumbar - Right Side Bend fingertip to jointline - stretching   Lumbar - Left Side Bend fingertip to jointline - stretching   Lumbar - Right Rotation 25% limited - slight pain   Lumbar - Left Rotation WFL - no pain     Strength   Overall Strength Within functional limits for tasks performed    Overall Strength Comments B LE grossly 4+/5 with no pain reproduction     Flexibility   Soft Tissue Assessment /Muscle Length yes   Hamstrings B: mild tightness     Palpation   Spinal mobility reduced spinal mobility throughout lower thoracic and lumbar spine - pain with gentle CPAs thorughout   Palpation comment some tenderness over B PSIS, L glute/piriformis     Special Tests    Special Tests Lumbar   Lumbar Tests Straight Leg Raise     Straight Leg Raise   Side  Left            Objective measurements completed on examination: See above findings.          [place] Adult PT Treatment/Exercise - 02/03/17 1701      Exercises   Exercises Lumbar     Lumbar Exercises: Stretches   Passive Hamstring Stretch 3 reps;30 seconds   Passive Hamstring Stretch Limitations L: supine with strap   Single Knee to Chest Stretch 2 reps;20 seconds   Single Knee to Chest Stretch Limitations B; opposite leg slightly extended   Lower Trunk Rotation 5 reps;10 seconds     Lumbar Exercises: Supine   Bridge 10 reps;5 seconds     Lumbar Exercises: Sidelying   Other Sidelying Lumbar Exercises open book - 5 x 10 sec each side                PT Education - 02/03/17 1728    Education provided Yes   Education Details exam findings, POC, HEP   Person(s) Educated Patient   Methods Handout;Explanation;Demonstration   Comprehension Verbalized understanding;Returned demonstration;Need further instruction          PT Short Term Goals - 02/03/17 1743      PT SHORT TERM GOAL #1   Title patient to be independent with initial HEP (02/17/17)   Status New           PT Long Term Goals - 02/03/17 1743      PT LONG TERM GOAL #1   Title patient to be independent with advacned HEP (all LTGs to be met by 03/17/17)   Status New     PT LONG TERM GOAL #2   Title patient to demonstrate good posture and body mechanics with ability to self correct, as needed for work and daily activities     Status New     PT [place] #3   Title Patient to report no numbness/tingling into L LE for greater than 2 weeks  Status New     PT LONG TERM GOAL #4   Title Patient to report ability ot perfrom ADLs, household tasks, and job duties with pain no greater than 2/10 for greater than 2 weeks.   Status New                Plan - 02/03/17 1729    Clinical Impression Statement Patient is a 41 y/o male presenting to [facility] today for primary complaints regarding chronic back pain with recent onset of numbness over L mid/lower back radiating into posterior L LE to approx knee level. Patient today with good lumbar AROM with subjective reports of only muscular tightness limiting movement. Patient with good strength throughout, however slight tightness noted in B hamstrings as well as tenderness throughout L paraspinals and with CPAs of lower thoracic and lumbar spinal segments. Patient to benefit from PT to address pain and mobility to allow for improved quality of life.    Clinical Presentation Stable   Clinical Presentation due to: no co-morbidities affecting POC   Clinical Decision Making Low   Rehab Potential Good   PT Frequency 2x / week   PT Duration 6 weeks   PT Treatment/Interventions ADLs/Self Care Home Management;Cryotherapy;Electrical Stimulation;Iontophoresis 30m/ml Dexamethasone;Moist Heat;Traction;Ultrasound;Neuromuscular re-education;Therapeutic exercise;Therapeutic activities;Functional mobility training;Patient/family education;Manual techniques;Taping;Dry needling;Vasopneumatic Device   Consulted and Agree with Plan of Care Patient      Patient will benefit from skilled therapeutic intervention in order to improve the following deficits and impairments:  Decreased activity tolerance, Pain, Decreased mobility  Visit Diagnosis: Chronic bilateral low back pain, with sciatica presence unspecified - Plan: PT plan of care cert/re-cert  Other symptoms and signs involving the  musculoskeletal system - Plan: PT plan of care cert/re-cert  Abnormal posture - Plan: PT plan of care cert/re-cert     Problem List Patient Active Problem List   Diagnosis Date Noted  . Lumbar degenerative disc disease 01/25/2017  . Numbness of right hand 01/25/2017  . Dyslipidemia 11/27/2016  . GAD (generalized anxiety disorder) 11/26/2016  . COPD, mild ([place] 11/26/2016  . Snoring 11/26/2016  . Allergic rhinitis 11/26/2016  . Smoker 11/26/2016  . ESOPHAGEAL REFLUX 04/28/2011  . HYPERLIPIDEMIA 04/02/2011  . HYPERTENSION 04/02/2011    SLanney Gins PT, DPT 02/03/17 5:47 PM    [facility] SRickardsvilleHRural [place]reat [place] [address]Phone: [phone]  Fax:  [phone] Name: Randall BELAIRMRN: [id number] of Birth: [birth year]

## 2017-02-03 NOTE — Patient Instructions (Signed)
Hamstring Step 2  Left foot relaxed, knee straight, other leg bent, foot flat. Raise straight leg further upward to maximal range. Hold _30__ seconds. Relax leg completely down. Repeat _3__ times.  Knee to Chest (Flexion)  Pull knee toward chest. Feel stretch in lower back or buttock area. Breathing deeply, Hold __20__ seconds. Repeat with other knee. Repeat __3-5__ times. Do __2__ sessions per day.  Bridging  Slowly raise buttocks from floor, keeping stomach tight. Repeat __15__ times per set. Do __2__ sets per session.    Lumbar Rotation (Non-Weight Bearing)  Feet on floor, slowly rock knees from side to side in small, pain-free range of motion. Allow lower back to rotate slightly. Repeat __10-15__ times per set. Do __2__ sets per session.   Open book  With hips and knees bent - arms out straight; "open" top arm and rotate Hold 10 seconds, repeat 10 times per side

## 2017-02-09 ENCOUNTER — Ambulatory Visit: Payer: 59 | Admitting: Physical Therapy

## 2017-02-09 DIAGNOSIS — G8929 Other chronic pain: Secondary | ICD-10-CM

## 2017-02-09 DIAGNOSIS — R293 Abnormal posture: Secondary | ICD-10-CM

## 2017-02-09 DIAGNOSIS — M545 Low back pain: Secondary | ICD-10-CM | POA: Diagnosis not present

## 2017-02-09 DIAGNOSIS — R29898 Other symptoms and signs involving the musculoskeletal system: Secondary | ICD-10-CM

## 2017-02-09 NOTE — Therapy (Signed)
[facility] [address]  [place], [place], [address] Phone: [phone]   Fax:  [phone]  Physical Therapy Treatment  Patient Details  Name: Randall Swanson MRN: [id number] Date of Birth: Jul 17, 1976 Referring Provider: Dr. Aundria Mems  Encounter Date: 02/09/2017      PT End of Session - 02/09/17 1657    Visit Number 2   Number of Visits 12   Date for PT Re-Evaluation 03/17/17   PT Start Time 1749   PT Stop Time 4496   PT Time Calculation (min) 39 min   Activity Tolerance Patient tolerated treatment well   Behavior During Therapy [facility] for tasks assessed/performed      Past Medical History:  Diagnosis Date  . Acid reflux   . COPD (chronic obstructive pulmonary disease) ([place])   . Hypertension     Past Surgical History:  Procedure Laterality Date  . HERNIA REPAIR      There were no vitals filed for this visit.      Subjective Assessment - 02/09/17 1656    Subjective "about the same"   Diagnostic tests Xray - degenerative changes   Patient Stated Goals improve pain/function   Currently in Pain? Yes   Pain Score 1    Pain Location Back   Pain Orientation Left;Lower   Pain Descriptors / Indicators Sore   Pain Type Chronic pain                         [facility] Adult PT Treatment/Exercise - 02/09/17 1658      Lumbar Exercises: Stretches   Press Ups 2 reps;30 seconds   Press Ups Limitations on elbows     Lumbar Exercises: Aerobic   Stationary Bike L3 x 6 min     Lumbar Exercises: Standing   Functional Squats 15 reps;3 seconds   Functional Squats Limitations TRX   Other Standing Lumbar Exercises B pallof press - single green band x 15 each side     Lumbar Exercises: Supine   Clam 15 reps;3 seconds   Clam Limitations green tband; ab set prior to movement   Bent Knee Raise 15 reps;3 seconds   Bent Knee Raise Limitations green tband - core engagement prior to movement   Bridge 15 reps;3 seconds   Bridge Limitations green tband   Isometric Hip Flexion 15 reps;5 seconds   Isometric Hip Flexion Limitations feet on peanut ball     Lumbar Exercises: Sidelying   Other Sidelying Lumbar Exercises open book - 5 x 10 sec each side     Manual Therapy   Manual Therapy Joint mobilization;Soft tissue mobilization   Manual therapy comments patient prone   Joint Mobilization Grade II-III CPAs of lumbar spine - each segment 3 x ~15 seconds   Soft tissue mobilization STM to thoracic and lumbar paraspinals                  PT Short Term Goals - 02/09/17 1657      PT SHORT TERM GOAL #1   Title patient to be independent with initial HEP (02/17/17)   Status On-going           PT Long Term Goals - 02/09/17 1657      PT LONG TERM GOAL #1   Title patient to be independent with advacned HEP (all LTGs to be met by 03/17/17)   Status On-going     PT LONG TERM GOAL #  2   Title patient to demonstrate good posture and body mechanics with ability to self correct, as needed for work and daily activities    Status On-going     PT LONG TERM GOAL #3   Title Patient to report no numbness/tingling into L LE for greater than 2 weeks    Status On-going     PT LONG TERM GOAL #4   Title Patient to report ability ot perfrom ADLs, household tasks, and job duties with pain no greater than 2/10 for greater than 2 weeks.   Status On-going               Plan - 02/09/17 1657    Clinical Impression Statement Patient doing well with all hip and core strengthening progressions today with no increase in pain. Will likely add activities to HEP at next visit according to repsonse after visit. Will continue to progress towards goals.    PT Treatment/Interventions ADLs/Self Care Home Management;Cryotherapy;Electrical Stimulation;Iontophoresis 44m/ml Dexamethasone;Moist Heat;Traction;Ultrasound;Neuromuscular re-education;Therapeutic exercise;Therapeutic activities;Functional  mobility training;Patient/family education;Manual techniques;Taping;Dry needling;Vasopneumatic Device   Consulted and Agree with Plan of Care Patient      Patient will benefit from skilled therapeutic intervention in order to improve the following deficits and impairments:  Decreased activity tolerance, Pain, Decreased mobility  Visit Diagnosis: Chronic bilateral low back pain, with sciatica presence unspecified  Other symptoms and signs involving the musculoskeletal system  Abnormal posture     Problem List Patient Active Problem List   Diagnosis Date Noted  . Lumbar degenerative disc disease 01/25/2017  . Numbness of right hand 01/25/2017  . Dyslipidemia 11/27/2016  . GAD (generalized anxiety disorder) 11/26/2016  . COPD, mild ([place] 11/26/2016  . Snoring 11/26/2016  . Allergic rhinitis 11/26/2016  . Smoker 11/26/2016  . ESOPHAGEAL REFLUX 04/28/2011  . HYPERLIPIDEMIA 04/02/2011  . HYPERTENSION 04/02/2011     SLanney Gins PT, DPT 02/09/17 5:44 PM   [facility] [place] [address]Phone: [phone]  Fax:  [phone] Name: Randall SPONAUGLEMRN: [id number] of Birth: 1April 25, 1977

## 2017-02-11 ENCOUNTER — Ambulatory Visit: Payer: 59 | Admitting: Physical Therapy

## 2017-02-11 DIAGNOSIS — R293 Abnormal posture: Secondary | ICD-10-CM | POA: Diagnosis not present

## 2017-02-11 DIAGNOSIS — R29898 Other symptoms and signs involving the musculoskeletal system: Secondary | ICD-10-CM | POA: Diagnosis not present

## 2017-02-11 DIAGNOSIS — G8929 Other chronic pain: Secondary | ICD-10-CM | POA: Diagnosis not present

## 2017-02-11 DIAGNOSIS — M545 Low back pain: Principal | ICD-10-CM

## 2017-02-11 NOTE — Therapy (Signed)
[facility] [address]  [place], [place], [address] Phone: [phone]   Fax:  [phone]  Physical Therapy Treatment  Patient Details  Name: Randall Swanson MRN: [id number] Date of Birth: 05-08-1976 Referring Provider: Dr. Aundria Mems  Encounter Date: 02/11/2017      PT End of Session - 02/11/17 1701    Visit Number 3   Number of Visits 12   Date for PT Re-Evaluation 03/17/17   PT Start Time 1700   PT Stop Time 1740   PT Time Calculation (min) 40 min   Activity Tolerance Patient tolerated treatment well   Behavior During Therapy [facility] for tasks assessed/performed      Past Medical History:  Diagnosis Date  . Acid reflux   . COPD (chronic obstructive pulmonary disease) ([place])   . Hypertension     Past Surgical History:  Procedure Laterality Date  . HERNIA REPAIR      There were no vitals filed for this visit.      Subjective Assessment - 02/11/17 1701    Subjective "not so bad today"   Diagnostic tests Xray - degenerative changes   Patient Stated Goals improve pain/function   Currently in Pain? Yes   Pain Score 1    Pain Location Back   Pain Orientation Lower;Left   Pain Descriptors / Indicators Sore   Pain Type Chronic pain                         [facility] Adult PT Treatment/Exercise - 02/11/17 1702      Lumbar Exercises: Stretches   Quadruped Mid Back Stretch 2 reps;30 seconds   Quadruped Mid Back Stretch Limitations childs pose     Lumbar Exercises: Aerobic   Stationary Bike L3 x 6 min     Lumbar Exercises: Standing   Lifting From floor  to chest height   Lifting Weights (lbs) 25   Other Standing Lumbar Exercises hip hinge - 10 x 10 sec     Lumbar Exercises: Supine   Clam 15 reps;3 seconds   Clam Limitations green tband; ab set prior to movement   Straight Leg Raise 15 reps   Straight Leg Raises Limitations B; 3#   Isometric Hip Flexion 15 reps;5 seconds    Other Supine Lumbar Exercises dead bug x 15 reps   Other Supine Lumbar Exercises straight leg bridge x 10; straight leg bridge + HS curl x 10      Lumbar Exercises: Quadruped   Mad[place]/Old Horse 10 reps   Madcat/Old Horse Limitations 5 sec                   PT Short Term Goals - 02/09/17 1657      PT SHORT TERM GOAL #1   Title patient to be independent with initial HEP (02/17/17)   Status On-going           PT Long Term Goals - 02/09/17 1657      PT LONG TERM GOAL #1   Title patient to be independent with advacned HEP (all LTGs to be met by 03/17/17)   Status On-going     PT LONG TERM GOAL #2   Title patient to demonstrate good posture and body mechanics with ability to self correct, as needed for work and daily activities    Status On-going     PT LONG TERM GOAL #3   Title Patient to report  no numbness/tingling into L LE for greater than 2 weeks    Status On-going     PT LONG TERM GOAL #4   Title Patient to report ability ot perfrom ADLs, household tasks, and job duties with pain no greater than 2/10 for greater than 2 weeks.   Status On-going               Plan - 02/11/17 1702    Clinical Impression Statement Good progression of more dynamic level core and hip strengthening today with no increase in pain. Review of lifting mechanics with patient tendency to heavily abduct/ER hips in low squat. WIll continue to focus on functioanl and work based activities to maximize functional mobility with reduced pain.    PT Treatment/Interventions ADLs/Self Care Home Management;Cryotherapy;Electrical Stimulation;Iontophoresis 49m/ml Dexamethasone;Moist Heat;Traction;Ultrasound;Neuromuscular re-education;Therapeutic exercise;Therapeutic activities;Functional mobility training;Patient/family education;Manual techniques;Taping;Dry needling;Vasopneumatic Device   Consulted and Agree with Plan of Care Patient      Patient will benefit from skilled therapeutic  intervention in order to improve the following deficits and impairments:  Decreased activity tolerance, Pain, Decreased mobility  Visit Diagnosis: Chronic bilateral low back pain, with sciatica presence unspecified  Other symptoms and signs involving the musculoskeletal system  Abnormal posture     Problem List Patient Active Problem List   Diagnosis Date Noted  . Lumbar degenerative disc disease 01/25/2017  . Numbness of right hand 01/25/2017  . Dyslipidemia 11/27/2016  . GAD (generalized anxiety disorder) 11/26/2016  . COPD, mild ([place] 11/26/2016  . Snoring 11/26/2016  . Allergic rhinitis 11/26/2016  . Smoker 11/26/2016  . ESOPHAGEAL REFLUX 04/28/2011  . HYPERLIPIDEMIA 04/02/2011  . HYPERTENSION 04/02/2011     Randall Swanson PT, DPT 02/11/17 6:03 PM   [place] [facility] [place] [address] Randall Swanson [address]Phone: [phone]  Fax:  [phone] Name: Randall LAMMMRN: [id number] of Birth: [birth year]

## 2017-02-11 NOTE — Patient Instructions (Signed)
Bilateral Isometric Hip Flexion   Tighten stomach and raise both knees to outstretched arms. Push gently, keeping arms straight, trunk rigid.  Hold _5-10___ seconds. Repeat __15__ times per set. Do __2__ sets per session.   Hip Hinge  10 x 10-15 seconds

## 2017-02-15 ENCOUNTER — Encounter: Payer: 59 | Admitting: Physical Therapy

## 2017-02-18 ENCOUNTER — Ambulatory Visit: Payer: 59 | Attending: Sports Medicine

## 2017-02-18 DIAGNOSIS — R293 Abnormal posture: Secondary | ICD-10-CM | POA: Insufficient documentation

## 2017-02-18 DIAGNOSIS — R29898 Other symptoms and signs involving the musculoskeletal system: Secondary | ICD-10-CM | POA: Insufficient documentation

## 2017-02-18 DIAGNOSIS — G8929 Other chronic pain: Secondary | ICD-10-CM | POA: Diagnosis present

## 2017-02-18 DIAGNOSIS — G4710 Hypersomnia, unspecified: Secondary | ICD-10-CM | POA: Diagnosis not present

## 2017-02-18 DIAGNOSIS — M545 Low back pain: Secondary | ICD-10-CM | POA: Insufficient documentation

## 2017-02-18 DIAGNOSIS — F17219 Nicotine dependence, cigarettes, with unspecified nicotine-induced disorders: Secondary | ICD-10-CM | POA: Diagnosis not present

## 2017-02-18 DIAGNOSIS — R0683 Snoring: Secondary | ICD-10-CM | POA: Diagnosis not present

## 2017-02-18 NOTE — Therapy (Signed)
[facility] [address]  [place], [place], [address] Phone: [phone]   Fax:  [phone]  Physical Therapy Treatment  Patient Details  Name: Randall Swanson MRN: [id number] Date of Birth: [birth year] Referring Provider: Dr. Aundria Mems  Encounter Date: 02/18/2017      PT End of Session - 02/18/17 1707    Visit Number 4   Number of Visits 12   Date for PT Re-Evaluation 03/17/17   PT Start Time 1702   PT Stop Time 1742   PT Time Calculation (min) 40 min   Activity Tolerance Patient tolerated treatment well   Behavior During Therapy [facility] for tasks assessed/performed      Past Medical History:  Diagnosis Date  . Acid reflux   . COPD (chronic obstructive pulmonary disease) ([place])   . Hypertension     Past Surgical History:  Procedure Laterality Date  . HERNIA REPAIR      There were no vitals filed for this visit.      Subjective Assessment - 02/18/17 1704    Subjective Pt. noting pain is a bit worse today however admitting to being awake for work duties since midnight.     Patient Stated Goals improve pain/function   Currently in Pain? Yes   Pain Score 1   7/10 back pain with lifting heavy boxes   Pain Location Back   Pain Orientation Lower;Right;Left   Pain Descriptors / Indicators Dull;Aching   Pain Type Chronic pain   Pain Onset More than a month ago   Pain Frequency Intermittent   Aggravating Factors  bending, lifting    Multiple Pain Sites No                         [facility] Adult PT Treatment/Exercise - 02/18/17 1712      Self-Care   Self-Care Other Self-Care Comments   Other Self-Care Comments  Discussion and demonstration of proper lifting work with box from floor; pt. verbalizing increased awareness this while lifting at [employer]       Lumbar Exercises: Stretches   Quadruped Mid Back Stretch 30 seconds;3 reps   Quadruped Mid Back Stretch Limitations 3-way childs  pose     Lumbar Exercises: Standing   Functional Squats 15 reps;5 seconds   Functional Squats Limitations TRX; blue TB around knees    Other Standing Lumbar Exercises Standing side-stepping with green TB 2 x 30 ft    Other Standing Lumbar Exercises hip hinge - 10 x 10 sec     Lumbar Exercises: Supine   Isometric Hip Flexion 15 reps;5 seconds   Other Supine Lumbar Exercises Dead bug x 15 reps   Other Supine Lumbar Exercises Straight leg bridge + HS curl x 15      Lumbar Exercises: Quadruped   [place] 10 reps   [place]/Old Horse Limitations 5 sec    Opposite Arm/Leg Raise Right arm/Left leg;Left arm/Right leg;15 reps   Opposite Arm/Leg Raise Limitations Quadruped over peanut p-ball                   PT Short Term Goals - 02/18/17 1711      PT SHORT TERM GOAL #1   Title patient to be independent with initial HEP (02/17/17)   Status Achieved           PT Long Term Goals - 02/09/17 1657      PT LONG TERM GOAL #1  Title patient to be independent with advacned HEP (all LTGs to be met by 03/17/17)   Status On-going     PT LONG TERM GOAL #2   Title patient to demonstrate good posture and body mechanics with ability to self correct, as needed for work and daily activities    Status On-going     PT [place] #3   Title Patient to report no numbness/tingling into L LE for greater than 2 weeks    Status On-going     PT LONG TERM GOAL #4   Title Patient to report ability ot perfrom ADLs, household tasks, and job duties with pain no greater than 2/10 for greater than 2 weeks.   Status On-going               Plan - 02/18/17 1722    Clinical Impression Statement Pt. 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  Tolerated continued lumbopelvic strengthening well today.  Further skilled instruction with proper lifting form with pt. verbalizing increased awareness of this with lifting at work.  Seems to  be progressing well.     PT Treatment/Interventions ADLs/Self Care Home Management;Cryotherapy;Electrical Stimulation;Iontophoresis 74m/ml Dexamethasone;Moist Heat;Traction;Ultrasound;Neuromuscular re-education;Therapeutic exercise;Therapeutic activities;Functional mobility training;Patient/family education;Manual techniques;Taping;Dry needling;Vasopneumatic Device      Patient will benefit from skilled therapeutic intervention in order to improve the following deficits and impairments:  Decreased activity tolerance, Pain, Decreased mobility  Visit Diagnosis: Chronic bilateral low back pain, with sciatica presence unspecified  Other symptoms and signs involving the musculoskeletal system  Abnormal posture     Problem List Patient Active Problem List   Diagnosis Date Noted  . Lumbar degenerative disc disease 01/25/2017  . Numbness of right hand 01/25/2017  . Dyslipidemia 11/27/2016  . GAD (generalized anxiety disorder) 11/26/2016  . COPD, mild ([place] 11/26/2016  . Snoring 11/26/2016  . Allergic rhinitis 11/26/2016  . Smoker 11/26/2016  . ESOPHAGEAL REFLUX 04/28/2011  . HYPERLIPIDEMIA 04/02/2011  . HYPERTENSION 04/02/2011    MBess Harvest PTA 02/18/17 5:54 PM  [facility] [address] [place] [address]Phone: [phone]  Fax:  [phone] Name: Randall RODGERSMRN: [id number] of Birth: 1November 25, 1977

## 2017-02-22 ENCOUNTER — Ambulatory Visit (INDEPENDENT_AMBULATORY_CARE_PROVIDER_SITE_OTHER): Payer: 59 | Admitting: Sports Medicine

## 2017-02-22 DIAGNOSIS — R200 Anesthesia of skin: Secondary | ICD-10-CM | POA: Diagnosis not present

## 2017-02-22 DIAGNOSIS — M51369 Other intervertebral disc degeneration, lumbar region without mention of lumbar back pain or lower extremity pain: Secondary | ICD-10-CM

## 2017-02-22 DIAGNOSIS — M5136 Other intervertebral disc degeneration, lumbar region: Secondary | ICD-10-CM | POA: Diagnosis not present

## 2017-02-22 NOTE — Progress Notes (Signed)
  Subjective:    CC: Follow-up  HPI: Low back pain: Improving significantly with physical therapy, paresthesias have resolved, still has a bit of axial pain that continues to improve. No bowel or bladder dysfunction, saddle numbness, constitutional symptoms.  Right hand numbness: Initially suspected carpal tunnel syndrome versus cervical radiculopathy, he has been more diligent with his nighttime splint use, and tells me symptoms have for the most part resolved.  Past medical history:  Negative.  See flowsheet/record as well for more information.  Surgical history: Negative.  See flowsheet/record as well for more information.  Family history: Negative.  See flowsheet/record as well for more information.  Social history: Negative.  See flowsheet/record as well for more information.  Allergies, and medications have been entered into the medical record, reviewed, and no changes needed.   Review of Systems: No fevers, chills, night sweats, weight loss, chest pain, or shortness of breath.   Objective:    General: Well Developed, well nourished, and in no acute distress.  Neuro: Alert and oriented x3, extra-ocular muscles intact, sensation grossly intact.  HEENT: Normocephalic, atraumatic, pupils equal round reactive to light, neck supple, no masses, no lymphadenopathy, thyroid nonpalpable.  Skin: Warm and dry, no rashes. Cardiac: Regular rate and rhythm, no murmurs rubs or gallops, no lower extremity edema.  Respiratory: Clear to auscultation bilaterally. Not using accessory muscles, speaking in full sentences.  Impression and Recommendations:    Lumbar degenerative disc disease Doing well with physical therapy, symptoms continue to improve. He will continue physical therapy twice a week, for 4 more weeks and then we can discuss this again. I do suspect spinal stenosis related pain, x-rays were normal. MRI if insufficient improvement by the next visit.  Numbness of right hand Initially  suspected tunnel syndrome versus cervical radiculitis. Was unable to complete nerve conduction/EMG years ago. He has increased his diligence with nighttime splinting and tells me symptoms have improved significantly, no changes today.  I spent 25 minutes with this patient, greater than 50% was face-to-face time counseling regarding the above diagnoses

## 2017-02-22 NOTE — Assessment & Plan Note (Signed)
Initially suspected tunnel syndrome versus cervical radiculitis. Was unable to complete nerve conduction/EMG years ago. He has increased his diligence with nighttime splinting and tells me symptoms have improved significantly, no changes today.

## 2017-02-22 NOTE — Assessment & Plan Note (Signed)
Doing well with physical therapy, symptoms continue to improve. He will continue physical therapy twice a week, for 4 more weeks and then we can discuss this again. I do suspect spinal stenosis related pain, x-rays were normal. MRI if insufficient improvement by the next visit.

## 2017-03-04 MED FILL — ESCITALOPRAM 20 MG TABLET: 20 | 90 days supply | Qty: 90 | Fill #1

## 2017-03-04 MED FILL — FLUTICASONE PROP 50 MCG SPR: 50 | 60 days supply | Qty: 16 | Fill #1

## 2017-03-04 MED FILL — ALPRAZolam 0.5 MG TABS: 0.5 | 60 days supply | Qty: 30 | Fill #1

## 2017-03-04 MED FILL — LOVASTATIN 20 MG TABLET: 20 | 90 days supply | Qty: 90 | Fill #1

## 2017-03-04 MED FILL — VALSARTAN 320 MG TAB: 320 | 90 days supply | Qty: 90 | Fill #1

## 2017-03-08 ENCOUNTER — Ambulatory Visit: Payer: 59 | Admitting: Physical Therapy

## 2017-03-08 DIAGNOSIS — G8929 Other chronic pain: Secondary | ICD-10-CM

## 2017-03-08 DIAGNOSIS — R29898 Other symptoms and signs involving the musculoskeletal system: Secondary | ICD-10-CM | POA: Diagnosis not present

## 2017-03-08 DIAGNOSIS — R293 Abnormal posture: Secondary | ICD-10-CM | POA: Diagnosis not present

## 2017-03-08 DIAGNOSIS — M545 Low back pain: Principal | ICD-10-CM

## 2017-03-08 NOTE — Therapy (Signed)
[facility] [address]  [place], [place], [address] Phone: [phone]   Fax:  [phone]  Physical Therapy Treatment  Patient Details  Name: Randall Swanson MRN: [id number] Date of Birth: Oct 22, 1975 Referring Provider: Dr. Aundria Mems  Encounter Date: 03/08/2017      PT End of Session - 03/08/17 1616    Visit Number 5   Number of Visits 12   Date for PT Re-Evaluation 03/17/17   PT Start Time 1616   PT Stop Time 1708   PT Time Calculation (min) 52 min   Activity Tolerance Patient tolerated treatment well   Behavior During Therapy [facility] for tasks assessed/performed      Past Medical History:  Diagnosis Date  . Acid reflux   . COPD (chronic obstructive pulmonary disease) ([place])   . Hypertension     Past Surgical History:  Procedure Laterality Date  . HERNIA REPAIR      There were no vitals filed for this visit.      Subjective Assessment - 03/08/17 1620    Subjective patient niticing most pain with bending; numbness has improved   Diagnostic tests Xray - degenerative changes   Patient Stated Goals improve pain/function   Currently in Pain? No/denies   Pain Score 0-No pain                         [facility] Adult PT Treatment/Exercise - 03/08/17 1621      Lumbar Exercises: Aerobic   Stationary Bike L3 x 6 min     Lumbar Exercises: Standing   Other Standing Lumbar Exercises B pallof press with shoulder flexion x 15 each side     Lumbar Exercises: Seated   Long Arc Quad on [place] Both;10 reps  2 sets   LAQ on [place] Limitations 2nd set with alternating UE flexion   Hip Flexion on Ball Both;10 reps  2 sets    Hip Flexion on Ball Limitations 2nd set with alternating UE flexion     Lumbar Exercises: Supine   Other Supine Lumbar Exercises dead bug - red tband at feet x 15 each   Other Supine Lumbar Exercises Straight leg bridge + HS curl x 15      Modalities   Modalities  Traction     Traction   Type of Traction Lumbar   Min (lbs) 55   Max (lbs) 66   Hold Time 60   Rest Time 20   Time 15                  PT Short Term Goals - 02/18/17 1711      PT SHORT TERM GOAL #1   Title patient to be independent with initial HEP (02/17/17)   Status Achieved           PT Long Term Goals - 02/09/17 1657      PT LONG TERM GOAL #1   Title patient to be independent with advacned HEP (all LTGs to be met by 03/17/17)   Status On-going     PT LONG TERM GOAL #2   Title patient to demonstrate good posture and body mechanics with ability to self correct, as needed for work and daily activities    Status On-going     PT LONG TERM GOAL #3   Title Patient to report no numbness/tingling into L LE for greater than 2 weeks    Status On-going  PT LONG TERM GOAL #4   Title Patient to report ability ot perfrom ADLs, household tasks, and job duties with pain no greater than 2/10 for greater than 2 weeks.   Status On-going               Plan - 03/08/17 1721    Clinical Impression Statement Patient noticing most pain with bending and standing up from bent over position, however N&T has mcuh improved since initiating therapy. Doing well with all lumbopelvic strenghtening. Traction to end session for hopeful pain relief.    PT Treatment/Interventions ADLs/Self Care Home Management;Cryotherapy;Electrical Stimulation;Iontophoresis 71m/ml Dexamethasone;Moist Heat;Traction;Ultrasound;Neuromuscular re-education;Therapeutic exercise;Therapeutic activities;Functional mobility training;Patient/family education;Manual techniques;Taping;Dry needling;Vasopneumatic Device   Consulted and Agree with Plan of Care Patient      Patient will benefit from skilled therapeutic intervention in order to improve the following deficits and impairments:  Decreased activity tolerance, Pain, Decreased mobility  Visit Diagnosis: Chronic bilateral low back pain, with sciatica  presence unspecified  Other symptoms and signs involving the musculoskeletal system  Abnormal posture     Problem List Patient Active Problem List   Diagnosis Date Noted  . Lumbar degenerative disc disease 01/25/2017  . Numbness of right hand 01/25/2017  . Dyslipidemia 11/27/2016  . GAD (generalized anxiety disorder) 11/26/2016  . COPD, mild ([place] 11/26/2016  . Snoring 11/26/2016  . Allergic rhinitis 11/26/2016  . Smoker 11/26/2016  . ESOPHAGEAL REFLUX 04/28/2011  . HYPERLIPIDEMIA 04/02/2011  . HYPERTENSION 04/02/2011     SLanney Gins PT, DPT 03/08/17 5:24 PM   [facility] [place] [address]Phone: [phone]  Fax:  [phone] Name: BKEIAN ODRISCOLLMRN: [id number] of Birth: 1July 19, 1977

## 2017-03-11 ENCOUNTER — Ambulatory Visit: Payer: 59 | Admitting: Physical Therapy

## 2017-03-11 DIAGNOSIS — G8929 Other chronic pain: Secondary | ICD-10-CM

## 2017-03-11 DIAGNOSIS — M545 Low back pain: Secondary | ICD-10-CM | POA: Diagnosis not present

## 2017-03-11 DIAGNOSIS — R29898 Other symptoms and signs involving the musculoskeletal system: Secondary | ICD-10-CM | POA: Diagnosis not present

## 2017-03-11 DIAGNOSIS — G4733 Obstructive sleep apnea (adult) (pediatric): Secondary | ICD-10-CM | POA: Diagnosis not present

## 2017-03-11 DIAGNOSIS — R0683 Snoring: Secondary | ICD-10-CM | POA: Diagnosis not present

## 2017-03-11 DIAGNOSIS — G4710 Hypersomnia, unspecified: Secondary | ICD-10-CM | POA: Diagnosis not present

## 2017-03-11 DIAGNOSIS — R293 Abnormal posture: Secondary | ICD-10-CM | POA: Diagnosis not present

## 2017-03-11 NOTE — Therapy (Signed)
[facility] [address]  [place], [place], [address] Phone: [phone]   Fax:  [phone]  Physical Therapy Treatment  Patient Details  Name: Randall Swanson MRN: [id number] Date of Birth: [birth year] Referring Provider: Dr. Aundria Mems  Encounter Date: 03/11/2017      PT End of Session - 03/11/17 1620    Visit Number 6   Number of Visits 12   Date for PT Re-Evaluation 03/17/17   PT Start Time 7591   PT Stop Time 1709   PT Time Calculation (min) 52 min   Activity Tolerance Patient tolerated treatment well   Behavior During Therapy [facility] for tasks assessed/performed      Past Medical History:  Diagnosis Date  . Acid reflux   . COPD (chronic obstructive pulmonary disease) ([place])   . Hypertension     Past Surgical History:  Procedure Laterality Date  . HERNIA REPAIR      There were no vitals filed for this visit.      Subjective Assessment - 03/11/17 1618    Subjective some pain today - had to unload truck   Patient Stated Goals improve pain/function   Currently in Pain? Yes   Pain Score 2    Pain Location Back   Pain Orientation Right;Left;Lower   Pain Descriptors / Indicators Sore   Pain Type Chronic pain                         [facility] Adult PT Treatment/Exercise - 03/11/17 1623      Lumbar Exercises: Stretches   Passive Hamstring Stretch 3 reps;30 seconds   Passive Hamstring Stretch Limitations B: supine with strap   Quad Stretch 3 reps;30 seconds   Quad Stretch Limitations B - prone with strap and LE elevated     Lumbar Exercises: Aerobic   Stationary Bike L3 x 6 min     Lumbar Exercises: Supine   Other Supine Lumbar Exercises dead bug - red tband at feet x 15 each     Lumbar Exercises: Prone   Other Prone Lumbar Exercises plank - elbows and toes 3 x 30 seconds     Lumbar Exercises: Quadruped   Opposite Arm/Leg Raise Right arm/Left leg;Left arm/Right leg;15  reps     Modalities   Modalities Traction     Traction   Type of Traction Lumbar   Min (lbs) 64   Max (lbs) 72   Hold Time 60   Rest Time 20   Time 15                  PT Short Term Goals - 02/18/17 1711      PT SHORT TERM GOAL #1   Title patient to be independent with initial HEP (02/17/17)   Status Achieved           PT Long Term Goals - 02/09/17 1657      PT LONG TERM GOAL #1   Title patient to be independent with advacned HEP (all LTGs to be met by 03/17/17)   Status On-going     PT LONG TERM GOAL #2   Title patient to demonstrate good posture and body mechanics with ability to self correct, as needed for work and daily activities    Status On-going     PT LONG TERM GOAL #3   Title Patient to report no numbness/tingling into L LE for greater than 2 weeks  Status On-going     PT LONG TERM GOAL #4   Title Patient to report ability ot perfrom ADLs, household tasks, and job duties with pain no greater than 2/10 for greater than 2 weeks.   Status On-going               Plan - 03/11/17 1620    Clinical Impression Statement Patient reporting good benefit from traction at last visit - wishes to continue today. Doing well today with all steretching and core work with no increase in pain. Will continue to progress towards goals.    PT Treatment/Interventions ADLs/Self Care Home Management;Cryotherapy;Electrical Stimulation;Iontophoresis 25m/ml Dexamethasone;Moist Heat;Traction;Ultrasound;Neuromuscular re-education;Therapeutic exercise;Therapeutic activities;Functional mobility training;Patient/family education;Manual techniques;Taping;Dry needling;Vasopneumatic Device   Consulted and Agree with Plan of Care Patient      Patient will benefit from skilled therapeutic intervention in order to improve the following deficits and impairments:  Decreased activity tolerance, Pain, Decreased mobility  Visit Diagnosis: Chronic bilateral low back pain, with  sciatica presence unspecified  Other symptoms and signs involving the musculoskeletal system  Abnormal posture     Problem List Patient Active Problem List   Diagnosis Date Noted  . Lumbar degenerative disc disease 01/25/2017  . Numbness of right hand 01/25/2017  . Dyslipidemia 11/27/2016  . GAD (generalized anxiety disorder) 11/26/2016  . COPD, mild ([place] 11/26/2016  . Snoring 11/26/2016  . Allergic rhinitis 11/26/2016  . Smoker 11/26/2016  . ESOPHAGEAL REFLUX 04/28/2011  . HYPERLIPIDEMIA 04/02/2011  . HYPERTENSION 04/02/2011     SLanney Gins PT, DPT 03/11/17 5:27 PM   [facility] [address] [place] [address]Phone: [phone]  Fax:  [phone] Name: BNAMAN SPYCHALSKIMRN: [id number] of Birth: 1March 31, 1977

## 2017-03-12 DIAGNOSIS — G4710 Hypersomnia, unspecified: Secondary | ICD-10-CM | POA: Diagnosis not present

## 2017-03-12 DIAGNOSIS — R0683 Snoring: Secondary | ICD-10-CM | POA: Diagnosis not present

## 2017-03-15 ENCOUNTER — Ambulatory Visit: Payer: 59 | Admitting: Physical Therapy

## 2017-03-15 DIAGNOSIS — M545 Low back pain: Principal | ICD-10-CM

## 2017-03-15 DIAGNOSIS — R29898 Other symptoms and signs involving the musculoskeletal system: Secondary | ICD-10-CM

## 2017-03-15 DIAGNOSIS — R293 Abnormal posture: Secondary | ICD-10-CM

## 2017-03-15 DIAGNOSIS — G8929 Other chronic pain: Secondary | ICD-10-CM | POA: Diagnosis not present

## 2017-03-15 NOTE — Therapy (Signed)
[facility] [address]  [place], [place], [address] Phone: [phone]   Fax:  [phone]  Physical Therapy Treatment  Patient Details  Name: Randall Swanson MRN: [id number] Date of Birth: 1975/08/15 Referring Provider: Dr. Aundria Mems  Encounter Date: 03/15/2017      PT End of Session - 03/15/17 1655    Visit Number 7   Number of Visits 12   Date for PT Re-Evaluation 03/17/17   PT Start Time 1652   PT Stop Time 1731   PT Time Calculation (min) 39 min   Activity Tolerance Patient tolerated treatment well   Behavior During Therapy [facility] for tasks assessed/performed      Past Medical History:  Diagnosis Date  . Acid reflux   . COPD (chronic obstructive pulmonary disease) ([place])   . Hypertension     Past Surgical History:  Procedure Laterality Date  . HERNIA REPAIR      There were no vitals filed for this visit.      Subjective Assessment - 03/15/17 1655    Subjective had an easy day - feels about the same   Diagnostic tests Xray - degenerative changes   Patient Stated Goals improve pain/function   Currently in Pain? No/denies   Pain Score 0-No pain                         [facility] Adult PT Treatment/Exercise - 03/15/17 1657      Exercises   Exercises Lumbar     Lumbar Exercises: Stretches   Passive Hamstring Stretch 3 reps;30 seconds   Passive Hamstring Stretch Limitations B: supine with strap   Piriformis Stretch 3 reps;30 seconds   Piriformis Stretch Limitations Bilateral - supine figure 4      Lumbar Exercises: Aerobic   Stationary Bike L3 x 6 min     Lumbar Exercises: Standing   Row 15 reps   Row Limitations TRX     Lumbar Exercises: Seated   Long Arc Quad on [place] Both;15 reps   LAQ on [place] Limitations alternating UE reaches - 3# at ankle   Hip Flexion on Ball Both;15 reps   Hip Flexion on Ball Limitations alternating UE reaches - 3# at ankle     Lumbar  Exercises: Prone   Other Prone Lumbar Exercises prone hip extension x 15 each LE     Lumbar Exercises: Quadruped   Opposite Arm/Leg Raise Right arm/Left leg;Left arm/Right leg;15 reps   Other Quadruped Lumbar Exercises fire hydrants - 15 x each side                  PT Short Term Goals - 02/18/17 1711      PT SHORT TERM GOAL #1   Title patient to be independent with initial HEP (02/17/17)   Status Achieved           PT Long Term Goals - 02/09/17 1657      PT LONG TERM GOAL #1   Title patient to be independent with advacned HEP (all LTGs to be met by 03/17/17)   Status On-going     PT LONG TERM GOAL #2   Title patient to demonstrate good posture and body mechanics with ability to self correct, as needed for work and daily activities    Status On-going     PT LONG TERM GOAL #3   Title Patient to report no numbness/tingling into L LE for  greater than 2 weeks    Status On-going     PT LONG TERM GOAL #4   Title Patient to report ability ot perfrom ADLs, household tasks, and job duties with pain no greater than 2/10 for greater than 2 weeks.   Status On-going               Plan - 03/15/17 1656    Clinical Impression Statement Patient doing well today - continues to have reports of back pain during lifitng at work. PT session continues to focus heavily on proximal hip and core strengthening as well as stretching for lumbopelvic stretching with good tolerance.    PT Treatment/Interventions ADLs/Self Care Home Management;Cryotherapy;Electrical Stimulation;Iontophoresis 64m/ml Dexamethasone;Moist Heat;Traction;Ultrasound;Neuromuscular re-education;Therapeutic exercise;Therapeutic activities;Functional mobility training;Patient/family education;Manual techniques;Taping;Dry needling;Vasopneumatic Device   Consulted and Agree with Plan of Care Patient      Patient will benefit from skilled therapeutic intervention in order to improve the following deficits and  impairments:  Decreased activity tolerance, Pain, Decreased mobility  Visit Diagnosis: Chronic bilateral low back pain, with sciatica presence unspecified  Other symptoms and signs involving the musculoskeletal system  Abnormal posture     Problem List Patient Active Problem List   Diagnosis Date Noted  . Lumbar degenerative disc disease 01/25/2017  . Numbness of right hand 01/25/2017  . Dyslipidemia 11/27/2016  . GAD (generalized anxiety disorder) 11/26/2016  . COPD, mild ([place] 11/26/2016  . Snoring 11/26/2016  . Allergic rhinitis 11/26/2016  . Smoker 11/26/2016  . ESOPHAGEAL REFLUX 04/28/2011  . HYPERLIPIDEMIA 04/02/2011  . HYPERTENSION 04/02/2011      SLanney Swanson PT, DPT 03/15/17 5:33 PM   [facility] [place] [address]Phone: [phone]  Fax:  [phone] Name: Randall MURTHAMRN: [id number] of Birth: [birth year]

## 2017-03-18 ENCOUNTER — Ambulatory Visit: Payer: 59

## 2017-03-18 DIAGNOSIS — R293 Abnormal posture: Secondary | ICD-10-CM | POA: Diagnosis not present

## 2017-03-18 DIAGNOSIS — G8929 Other chronic pain: Secondary | ICD-10-CM

## 2017-03-18 DIAGNOSIS — R29898 Other symptoms and signs involving the musculoskeletal system: Secondary | ICD-10-CM | POA: Diagnosis not present

## 2017-03-18 DIAGNOSIS — M545 Low back pain: Secondary | ICD-10-CM | POA: Diagnosis not present

## 2017-03-18 NOTE — Therapy (Signed)
[facility] [address]  [place], [place], [address] Phone: [phone]   Fax:  [phone]  Physical Therapy Treatment  Patient Details  Name: Randall [place] MRN: [id number] Date of Birth: [birth year] Referring Provider: Dr. Aundria Mems  Encounter Date: 03/18/2017      PT End of Session - 03/18/17 1708    Visit Number 8   Number of Visits 12   Date for PT Re-Evaluation 03/17/17   PT Start Time 1703   PT Stop Time 1746   PT Time Calculation (min) 43 min   Activity Tolerance Patient tolerated treatment well   Behavior During Therapy [facility] for tasks assessed/performed      Past Medical History:  Diagnosis Date  . Acid reflux   . COPD (chronic obstructive pulmonary disease) ([place])   . Hypertension     Past Surgical History:  Procedure Laterality Date  . HERNIA REPAIR      There were no vitals filed for this visit.      Subjective Assessment - 03/18/17 1705    Subjective Pt. noting no numbness in LE in a week and a half.   Patient Stated Goals improve pain/function   Currently in Pain? Yes   Pain Score 1    Pain Location Back   Pain Orientation Left;Lower   Pain Descriptors / Indicators Dull   Pain Type Chronic pain   Pain Radiating Towards none today.     Pain Onset More than a month ago   Pain Frequency Intermittent   Aggravating Factors  lifting, bending   Pain Relieving Factors resting, laying down, heat, ice   Multiple Pain Sites No                         [facility] Adult PT Treatment/Exercise - 03/18/17 1714      Lumbar Exercises: Stretches   Passive Hamstring Stretch 3 reps;30 seconds   Passive Hamstring Stretch Limitations B: supine with strap   Single Knee to Chest Stretch 2 reps;20 seconds   Single Knee to Chest Stretch Limitations B; opposite leg slightly extended     Lumbar Exercises: Aerobic   Stationary Bike L3 x 6 min     Lumbar Exercises: Standing   Functional Squats 15 reps;5 seconds   Functional Squats Limitations counter; green TB    Other Standing Lumbar Exercises Standing side-stepping with green TB 2 x 30 ft    Other Standing Lumbar Exercises B fitter hip extension (1 black, 2 blue bands) x 15 reps each way     Lumbar Exercises: Supine   Bridge 15 reps;3 seconds   Bridge Limitations with green TB      Lumbar Exercises: Sidelying   Clam 15 reps;3 seconds   Clam Limitations Bil; green TB      Lumbar Exercises: Quadruped   Other Quadruped Lumbar Exercises fire hydrants - 15 x each side                  PT Short Term Goals - 02/18/17 1711      PT SHORT TERM GOAL #1   Title patient to be independent with initial HEP (02/17/17)   Status Achieved           PT Long Term Goals - 03/18/17 1743      PT LONG TERM GOAL #1   Title patient to be independent with advacned HEP (all LTGs to be met by 03/17/17)  Status On-going     PT LONG TERM GOAL #2   Title patient to demonstrate good posture and body mechanics with ability to self correct, as needed for work and daily activities    Status On-going     PT LONG TERM GOAL #3   Title Patient to report no numbness/tingling into L LE for greater than 2 weeks    Status On-going  pt. noting week and a half without nunbness/tingling into L LE      PT LONG TERM GOAL #4   Title Patient to report ability ot perfrom ADLs, household tasks, and job duties with pain no greater than 2/10 for greater than 2 weeks.   Status On-going  still 6/10 pain at worst with lifting at work               Plan - 03/18/17 1709    Clinical Impression Statement Pt. noting good improvement in back pain levels and tingling/numbness with therapy thus far.  Pt. reports no numbness/tingling in L LE for week and a half.  Noting back pain levels still getting to 6/10 at worst with lifting at work.  Treatment focusing on proximal hip strengthening to improve stability with work related lifting.   Pt. tolerated all activities in treatment well and seems to be progressing.   PT Treatment/Interventions ADLs/Self Care Home Management;Cryotherapy;Electrical Stimulation;Iontophoresis 85m/ml Dexamethasone;Moist Heat;Traction;Ultrasound;Neuromuscular re-education;Therapeutic exercise;Therapeutic activities;Functional mobility training;Patient/family education;Manual techniques;Taping;Dry needling;Vasopneumatic Device      Patient will benefit from skilled therapeutic intervention in order to improve the following deficits and impairments:  Decreased activity tolerance, Pain, Decreased mobility  Visit Diagnosis: Chronic bilateral low back pain, with sciatica presence unspecified  Other symptoms and signs involving the musculoskeletal system  Abnormal posture     Problem List Patient Active Problem List   Diagnosis Date Noted  . Lumbar degenerative disc disease 01/25/2017  . Numbness of right hand 01/25/2017  . Dyslipidemia 11/27/2016  . GAD (generalized anxiety disorder) 11/26/2016  . COPD, mild ([place] 11/26/2016  . Snoring 11/26/2016  . Allergic rhinitis 11/26/2016  . Smoker 11/26/2016  . ESOPHAGEAL REFLUX 04/28/2011  . HYPERLIPIDEMIA 04/02/2011  . HYPERTENSION 04/02/2011    MBess Harvest PTA 03/18/17 5:54 PM  [facility] [address] [place] [address]Phone: [phone]  Fax:  [phone] Name: Randall CALDRONMRN: [id number] of Birth: [birth year]

## 2017-03-23 ENCOUNTER — Encounter: Payer: Self-pay | Admitting: Sports Medicine

## 2017-03-23 ENCOUNTER — Ambulatory Visit (INDEPENDENT_AMBULATORY_CARE_PROVIDER_SITE_OTHER): Payer: 59 | Admitting: Sports Medicine

## 2017-03-23 ENCOUNTER — Ambulatory Visit: Payer: 59 | Attending: Sports Medicine

## 2017-03-23 VITALS — BP 129/75 | HR 66 | Wt 243.0 lb

## 2017-03-23 DIAGNOSIS — R200 Anesthesia of skin: Secondary | ICD-10-CM | POA: Diagnosis not present

## 2017-03-23 DIAGNOSIS — M5136 Other intervertebral disc degeneration, lumbar region: Secondary | ICD-10-CM

## 2017-03-23 DIAGNOSIS — R29898 Other symptoms and signs involving the musculoskeletal system: Secondary | ICD-10-CM | POA: Insufficient documentation

## 2017-03-23 DIAGNOSIS — I10 Essential (primary) hypertension: Secondary | ICD-10-CM | POA: Diagnosis not present

## 2017-03-23 DIAGNOSIS — M545 Low back pain: Secondary | ICD-10-CM | POA: Insufficient documentation

## 2017-03-23 DIAGNOSIS — Z23 Encounter for immunization: Secondary | ICD-10-CM | POA: Diagnosis not present

## 2017-03-23 DIAGNOSIS — G8929 Other chronic pain: Secondary | ICD-10-CM | POA: Diagnosis present

## 2017-03-23 DIAGNOSIS — R293 Abnormal posture: Secondary | ICD-10-CM | POA: Insufficient documentation

## 2017-03-23 DIAGNOSIS — G4733 Obstructive sleep apnea (adult) (pediatric): Secondary | ICD-10-CM | POA: Diagnosis not present

## 2017-03-23 DIAGNOSIS — E669 Obesity, unspecified: Secondary | ICD-10-CM | POA: Diagnosis not present

## 2017-03-23 DIAGNOSIS — M51369 Other intervertebral disc degeneration, lumbar region without mention of lumbar back pain or lower extremity pain: Secondary | ICD-10-CM

## 2017-03-23 NOTE — Assessment & Plan Note (Signed)
Continuing to work with physical therapy, doing very well. He will do the exercises at home, and return to see me as needed. Pain is mostly axial and sounds to be spinal stenosis.

## 2017-03-23 NOTE — Therapy (Signed)
[facility] [address]  [place], [place], [address] Phone: [phone]   Fax:  [phone]  Physical Therapy Treatment  Patient Details  Name: [place] MRN: [id number] Date of Birth: 1975-11-23 Referring Provider: Dr. Aundria Mems  Encounter Date: 03/23/2017      PT End of Session - 03/23/17 1706    Visit Number 9   Number of Visits 12   Date for PT Re-Evaluation 03/17/17   PT Start Time 1703   PT Stop Time 1746   PT Time Calculation (min) 43 min   Activity Tolerance Patient tolerated treatment well   Behavior During Therapy [facility] for tasks assessed/performed      Past Medical History:  Diagnosis Date  . Acid reflux   . COPD (chronic obstructive pulmonary disease) ([place])   . Hypertension     Past Surgical History:  Procedure Laterality Date  . HERNIA REPAIR      There were no vitals filed for this visit.      Subjective Assessment - 03/23/17 1706    Subjective Kionte doing well noting back pain was low over busy work weekend.  Noting less frequent and intense LBP since starting therapy.    Patient Stated Goals improve pain/function   Currently in Pain? No/denies   Pain Score 0-No pain   Multiple Pain Sites No                         [facility] Adult PT Treatment/Exercise - 03/23/17 1720      Lumbar Exercises: Stretches   Passive Hamstring Stretch 30 seconds;2 reps   Passive Hamstring Stretch Limitations B: supine with strap   Piriformis Stretch 30 seconds;2 reps   Piriformis Stretch Limitations Bilateral - supine figure 4      Lumbar Exercises: Aerobic   Stationary Bike NuStep: lvl 6, 6 min   bike unavailable      Lumbar Exercises: Machines for Strengthening   Leg Press --  B single arm low row 25# x 15 reps each side    Other Lumbar Machine Exercise BATCA straight arm pull down (cues for abdom. activation) 25# x 20 reps   Other Lumbar Machine Exercise BATCA  pulldown 35# x 15 reps     Lumbar Exercises: Standing   Functional Squats 20 reps;3 seconds   Functional Squats Limitations TRX    Row 15 reps   Row Limitations TRX      Lumbar Exercises: Supine   Other Supine Lumbar Exercises dead bug x 15 reps each      Lumbar Exercises: Quadruped   Opposite Arm/Leg Raise Right arm/Left leg;Left arm/Right leg;15 reps   Other Quadruped Lumbar Exercises fire hydrants - 15 x each side                  PT Short Term Goals - 02/18/17 1711      PT SHORT TERM GOAL #1   Title patient to be independent with initial HEP (02/17/17)   Status Achieved           PT Long Term Goals - 03/18/17 1743      PT LONG TERM GOAL #1   Title patient to be independent with advacned HEP (all LTGs to be met by 03/17/17)   Status On-going     PT LONG TERM GOAL #2   Title patient to demonstrate good posture and body mechanics with ability to self correct, as needed  for work and daily activities    Status On-going     PT [place] #3   Title Patient to report no numbness/tingling into L LE for greater than 2 weeks    Status On-going  pt. noting week and a half without nunbness/tingling into L LE      PT LONG TERM GOAL #4   Title Patient to report ability ot perfrom ADLs, household tasks, and job duties with pain no greater than 2/10 for greater than 2 weeks.   Status On-going  still 6/10 pain at worst with lifting at work               Plan - 03/23/17 1724    Clinical Impression Statement Pt. doing well today noting he has felt a noticeable decrease in LBP frequency and intensity since starting therapy.  Tolerated all lumbopelvic strengthening activities well today and notes less LBP with lifting and increased consciousness of proper lifting technique at work.  Able to tolerate busy work weekend without LBP flare-up.  Seems to be progressing well.     PT Treatment/Interventions ADLs/Self Care Home Management;Cryotherapy;Electrical  Stimulation;Iontophoresis 57m/ml Dexamethasone;Moist Heat;Traction;Ultrasound;Neuromuscular re-education;Therapeutic exercise;Therapeutic activities;Functional mobility training;Patient/family education;Manual techniques;Taping;Dry needling;Vasopneumatic Device      Patient will benefit from skilled therapeutic intervention in order to improve the following deficits and impairments:  Decreased activity tolerance, Pain, Decreased mobility  Visit Diagnosis: Chronic bilateral low back pain, with sciatica presence unspecified  Other symptoms and signs involving the musculoskeletal system  Abnormal posture     Problem List Patient Active Problem List   Diagnosis Date Noted  . Lumbar degenerative disc disease 01/25/2017  . Numbness of right hand 01/25/2017  . Dyslipidemia 11/27/2016  . GAD (generalized anxiety disorder) 11/26/2016  . COPD, mild ([place] 11/26/2016  . Snoring 11/26/2016  . Allergic rhinitis 11/26/2016  . Smoker 11/26/2016  . ESOPHAGEAL REFLUX 04/28/2011  . HYPERLIPIDEMIA 04/02/2011  . HYPERTENSION 04/02/2011    MBess Harvest PTA 03/23/17 6:17 PM  [facility] [address] [place] [address]Phone: [phone]  Fax:  [phone] Name: Randall PRYMRN: [id number] of Birth: [birth year]

## 2017-03-23 NOTE — Assessment & Plan Note (Signed)
Likely carpal tunnel syndrome, continues to improve with nighttime splinting. Several years ago he was unable to complete a nerve conduction/EMG test. He is overall happy with how things are going, and can return as needed, if persistent symptoms we will do a median nerve hydrodissection.

## 2017-03-23 NOTE — Progress Notes (Signed)
  Subjective:    CC: Follow-up  HPI: Right carpal tunnel syndrome: Essentially resolved.  Low back pain: Continues to improve with physical therapy.  Past medical history:  Negative.  See flowsheet/record as well for more information.  Surgical history: Negative.  See flowsheet/record as well for more information.  Family history: Negative.  See flowsheet/record as well for more information.  Social history: Negative.  See flowsheet/record as well for more information.  Allergies, and medications have been entered into the medical record, reviewed, and no changes needed.   Review of Systems: No fevers, chills, night sweats, weight loss, chest pain, or shortness of breath.   Objective:    General: Well Developed, well nourished, and in no acute distress.  Neuro: Alert and oriented x3, extra-ocular muscles intact, sensation grossly intact.  HEENT: Normocephalic, atraumatic, pupils equal round reactive to light, neck supple, no masses, no lymphadenopathy, thyroid nonpalpable.  Skin: Warm and dry, no rashes. Cardiac: Regular rate and rhythm, no murmurs rubs or gallops, no lower extremity edema.  Respiratory: Clear to auscultation bilaterally. Not using accessory muscles, speaking in full sentences.  Impression and Recommendations:    Lumbar degenerative disc disease Continuing to work with physical therapy, doing very well. He will do the exercises at home, and return to see me as needed. Pain is mostly axial and sounds to be spinal stenosis.  Numbness of right hand Likely carpal tunnel syndrome, continues to improve with nighttime splinting. Several years ago he was unable to complete a nerve conduction/EMG test. He is overall happy with how things are going, and can return as needed, if persistent symptoms we will do a median nerve hydrodissection.   ___________________________________________ Ihor Austinhomas J. Benjamin Stainhekkekandam, M.D., ABFM., CAQSM. Primary Care and Sports Medicine [facility] [place]  Adjunct Instructor of Family Medicine  [employer]

## 2017-03-25 ENCOUNTER — Ambulatory Visit: Payer: 59 | Admitting: Physical Therapy

## 2017-03-25 DIAGNOSIS — M545 Low back pain: Secondary | ICD-10-CM | POA: Diagnosis not present

## 2017-03-25 DIAGNOSIS — R29898 Other symptoms and signs involving the musculoskeletal system: Secondary | ICD-10-CM

## 2017-03-25 DIAGNOSIS — G8929 Other chronic pain: Secondary | ICD-10-CM

## 2017-03-25 DIAGNOSIS — R293 Abnormal posture: Secondary | ICD-10-CM

## 2017-03-25 NOTE — Therapy (Signed)
[place] [address]  [place], [place], [address] Phone: [phone]   Fax:  [phone]  Physical Therapy Treatment  Patient Details  Name: Randall [place] MRN: [id number] Date of Birth: August 27, 1975 Referring Provider: Dr. Aundria Mems  Encounter Date: 03/25/2017      PT End of Session - 03/25/17 1618    Visit Number 10   Number of Visits 12   Date for PT Re-Evaluation 03/17/17   PT Start Time 1618   PT Stop Time 1658   PT Time Calculation (min) 40 min   Activity Tolerance Patient tolerated treatment well   Behavior During Therapy [facility] for tasks assessed/performed      Past Medical History:  Diagnosis Date  . Acid reflux   . COPD (chronic obstructive pulmonary disease) ([place])   . Hypertension     Past Surgical History:  Procedure Laterality Date  . HERNIA REPAIR      There were no vitals filed for this visit.      Subjective Assessment - 03/25/17 1619    Subjective Pt reporting pain a little worse today, but unable to identify a trigger.   Patient Stated Goals improve pain/function   Currently in Pain? Yes   Pain Score 2    Pain Location Back   Pain Orientation Left;Lower   Pain Descriptors / Indicators Dull   Pain Type Chronic pain                         [facility] Adult PT Treatment/Exercise - 03/25/17 1618      Exercises   Exercises Lumbar     Lumbar Exercises: Aerobic   Stationary Bike L3 x 6 min     Lumbar Exercises: Machines for Strengthening   Other Lumbar Machine Exercise B cable pallof press with shoulder flexion 5# x15 each side   Other Lumbar Machine Exercise B cable upward row 5# x15 each side     Lumbar Exercises: Standing   Other Standing Lumbar Exercises Ab set + B 4 way SLR with green TB x10     Lumbar Exercises: Prone   Straight Leg Raise 15 reps;3 seconds   Opposite Arm/Leg Raise Right arm/Left leg;Left arm/Right leg;10 reps;3 seconds     Lumbar Exercises: Quadruped   Opposite Arm/Leg Raise Right arm/Left leg;Left arm/Right leg;15 reps                  PT Short Term Goals - 02/18/17 1711      PT SHORT TERM GOAL #1   Title patient to be independent with initial HEP (02/17/17)   Status Achieved           PT Long Term Goals - 03/18/17 1743      PT LONG TERM GOAL #1   Title patient to be independent with advacned HEP (all LTGs to be met by 03/17/17)   Status On-going     PT LONG TERM GOAL #2   Title patient to demonstrate good posture and body mechanics with ability to self correct, as needed for work and daily activities    Status On-going     PT LONG TERM GOAL #3   Title Patient to report no numbness/tingling into L LE for greater than 2 weeks    Status On-going  pt. noting week and a half without nunbness/tingling into L LE      PT LONG TERM GOAL #4   Title Patient  to report ability ot perfrom ADLs, household tasks, and job duties with pain no greater than 2/10 for greater than 2 weeks.   Status On-going  still 6/10 pain at worst with lifting at work               Plan - 03/25/17 1621    Clinical Impression Statement Pt reporting mild increased pain w/o known trigger today. Tolerating exercise progression with fatigue noted but no increased pain and denied need for modalities to address pain. Pt nearing end of current POC and expecting to transition to HEP at that time.   Rehab Potential Good   PT Treatment/Interventions ADLs/Self Care Home Management;Cryotherapy;Electrical Stimulation;Iontophoresis 4mg/ml Dexamethasone;Moist Heat;Traction;Ultrasound;Neuromuscular re-education;Therapeutic exercise;Therapeutic activities;Functional mobility training;Patient/family education;Manual techniques;Taping;Dry needling;Vasopneumatic Device   Consulted and Agree with Plan of Care Patient      Patient will benefit from skilled therapeutic intervention in order to improve the following deficits and  impairments:  Decreased activity tolerance, Pain, Decreased mobility  Visit Diagnosis: Chronic bilateral low back pain, with sciatica presence unspecified  Other symptoms and signs involving the musculoskeletal system  Abnormal posture     Problem List Patient Active Problem List   Diagnosis Date Noted  . Lumbar degenerative disc disease 01/25/2017  . Numbness of right hand 01/25/2017  . Dyslipidemia 11/27/2016  . GAD (generalized anxiety disorder) 11/26/2016  . COPD, mild ([facility]) 11/26/2016  . Snoring 11/26/2016  . Allergic rhinitis 11/26/2016  . Smoker 11/26/2016  . ESOPHAGEAL REFLUX 04/28/2011  . HYPERLIPIDEMIA 04/02/2011  . HYPERTENSION 04/02/2011     M , PT, MPT 03/25/2017, 5:01 PM  [facility] [place] [address]  [address] [place], [place], [address] Phone: [phone]   Fax:  [phone]  Name: [place] MRN: [id number] Date of Birth: 11/20/1975   

## 2017-03-29 ENCOUNTER — Ambulatory Visit: Payer: 59 | Admitting: Physical Therapy

## 2017-03-29 DIAGNOSIS — G8929 Other chronic pain: Secondary | ICD-10-CM

## 2017-03-29 DIAGNOSIS — R293 Abnormal posture: Secondary | ICD-10-CM | POA: Diagnosis not present

## 2017-03-29 DIAGNOSIS — R29898 Other symptoms and signs involving the musculoskeletal system: Secondary | ICD-10-CM | POA: Diagnosis not present

## 2017-03-29 DIAGNOSIS — M545 Low back pain: Secondary | ICD-10-CM | POA: Diagnosis not present

## 2017-03-29 NOTE — Therapy (Addendum)
[facility] [address]  [place], [place], [address] Phone: [phone]   Fax:  [phone]  Physical Therapy Treatment  Patient Details  Name: LENOARD HELBERT MRN: [id number] Date of Birth: 07-27-1975 Referring Provider: Dr. Aundria Mems  Encounter Date: 03/29/2017      PT End of Session - 03/29/17 1641    Visit Number 11   Number of Visits 12   PT Start Time 5027   PT Stop Time 1719   PT Time Calculation (min) 40 min   Activity Tolerance Patient tolerated treatment well   Behavior During Therapy [facility] for tasks assessed/performed      Past Medical History:  Diagnosis Date  . Acid reflux   . COPD (chronic obstructive pulmonary disease) ([place])   . Hypertension     Past Surgical History:  Procedure Laterality Date  . HERNIA REPAIR      There were no vitals filed for this visit.      Subjective Assessment - 03/29/17 1641    Subjective Back hurt this weekend; today is last visit, ready to transition to HEP   Diagnostic tests Xray - degenerative changes   Patient Stated Goals improve pain/function   Currently in Pain? Yes   Pain Score 1    Pain Location Back   Pain Orientation Lower   Pain Descriptors / Indicators Sore   Pain Type Chronic pain                         [facility] Adult PT Treatment/Exercise - 03/29/17 1643      Exercises   Exercises Lumbar     Lumbar Exercises: Aerobic   Stationary Bike L3 x 6 min     Lumbar Exercises: Standing   Functional Squats 15 reps   Functional Squats Limitations TRX   Forward Lunge 15 reps   Forward Lunge Limitations B - TRX     Lumbar Exercises: Supine   Clam 15 reps   Clam Limitations red tband   Bridge 15 reps;3 seconds   Bridge Limitations red tband   Other Supine Lumbar Exercises dead bug x 15 reps each - red tband at feet     Lumbar Exercises: Quadruped   [place]cat/Old Horse 15 reps   Opposite Arm/Leg Raise Right arm/Left  leg;Left arm/Right leg;15 reps   Opposite Arm/Leg Raise Limitations alternating   Other Quadruped Lumbar Exercises fire hydrants - 15 x each side   Other Quadruped Lumbar Exercises childs pose 3 x 30 sec                  PT Short Term Goals - 02/18/17 1711      PT SHORT TERM GOAL #1   Title patient to be independent with initial HEP (02/17/17)   Status Achieved           PT Long Term Goals - 03/29/17 1642      PT LONG TERM GOAL #1   Title patient to be independent with advacned HEP (all LTGs to be met by 03/17/17)   Status Achieved     PT LONG TERM GOAL #2   Title patient to demonstrate good posture and body mechanics with ability to self correct, as needed for work and daily activities    Status Achieved     PT LONG TERM GOAL #3   Title Patient to report no numbness/tingling into L LE for greater than 2 weeks  Status Achieved     PT LONG TERM GOAL #4   Title Patient to report ability ot perfrom ADLs, household tasks, and job duties with pain no greater than 2/10 for greater than 2 weeks.   Status Partially Met               Plan - 03/29/17 1642    Clinical Impression Statement Patient noting today will be last visit as he feel spreapred to transition to HEP at this time. Patient has met or partially met all previously established goals with good adherence to current HEP. Patient with come contiued back pain, however, likely due to high demands at work with little time available for true rest from work requirements. Review of all posture and body mechanics with good carryover. Will plan to hold chart open for 30 days in the event patient need to return. Otherwise, will gladly see patient in the future for any other needs.    PT Treatment/Interventions ADLs/Self Care Home Management;Cryotherapy;Electrical Stimulation;Iontophoresis 33m/ml Dexamethasone;Moist Heat;Traction;Ultrasound;Neuromuscular re-education;Therapeutic exercise;Therapeutic activities;Functional  mobility training;Patient/family education;Manual techniques;Taping;Dry needling;Vasopneumatic Device   Consulted and Agree with Plan of Care Patient      Patient will benefit from skilled therapeutic intervention in order to improve the following deficits and impairments:  Decreased activity tolerance, Pain, Decreased mobility  Visit Diagnosis: Chronic bilateral low back pain, with sciatica presence unspecified  Other symptoms and signs involving the musculoskeletal system  Abnormal posture     Problem List Patient Active Problem List   Diagnosis Date Noted  . Lumbar degenerative disc disease 01/25/2017  . Numbness of right hand 01/25/2017  . Dyslipidemia 11/27/2016  . GAD (generalized anxiety disorder) 11/26/2016  . COPD, mild ([place] 11/26/2016  . Snoring 11/26/2016  . Allergic rhinitis 11/26/2016  . Smoker 11/26/2016  . ESOPHAGEAL REFLUX 04/28/2011  . HYPERLIPIDEMIA 04/02/2011  . HYPERTENSION 04/02/2011     SLanney Gins PT, DPT 03/29/17 5:20 PM  PHYSICAL THERAPY DISCHARGE SUMMARY  Visits from Start of Care: 11  Current functional level related to goals / functional outcomes: See above   Remaining deficits: See above   Education / Equipment: HEP  Plan: Patient agrees to discharge.  Patient goals were met. Patient is being discharged due to being pleased with the current functional level.  ?????    SLanney Gins PT, DPT 06/08/17 2:45 PM    [facility] [address] [place] [address]Phone: [phone]  Fax:  [phone] Name: BSHELTON SQUAREMRN: [id number] of Birth: [birth year]

## 2017-03-29 NOTE — Patient Instructions (Signed)
1. Squats  2. Rows (relax upper shoulders)  3. Lunges (wide stance, let back heel come off of ground)  4. Planks (regular and side)  5. Bridges (red band at knees)  6. 4-way hip kickers (band at ankle/in door)  7. Chest press (band in door, slight bend at A Rosie Placekneees)  8. Dead bug (red band at feet)  9. Hamstring stretch  10. Clams (on back and side - red or green band at knees)  11. Bird Dog (opposite arm/leg alternating)  12. Academic librarianire hydrant  13. Isometric Hip Flexion (pushing hands and knees)  14. Marjo BickerChilds pose  15. Cat/Camel

## 2017-04-01 ENCOUNTER — Ambulatory Visit: Payer: 59 | Admitting: Physical Therapy

## 2017-04-09 DIAGNOSIS — G4733 Obstructive sleep apnea (adult) (pediatric): Secondary | ICD-10-CM | POA: Diagnosis not present

## 2017-05-09 DIAGNOSIS — G4733 Obstructive sleep apnea (adult) (pediatric): Secondary | ICD-10-CM | POA: Diagnosis not present

## 2017-05-13 ENCOUNTER — Ambulatory Visit (INDEPENDENT_AMBULATORY_CARE_PROVIDER_SITE_OTHER): Payer: 59 | Admitting: Family Medicine

## 2017-05-13 VITALS — BP 135/76 | HR 55 | Temp 97.6°F | Ht 73.0 in | Wt 239.6 lb

## 2017-05-13 DIAGNOSIS — E785 Hyperlipidemia, unspecified: Secondary | ICD-10-CM

## 2017-05-13 DIAGNOSIS — Z5181 Encounter for therapeutic drug level monitoring: Secondary | ICD-10-CM | POA: Diagnosis not present

## 2017-05-13 DIAGNOSIS — I10 Essential (primary) hypertension: Secondary | ICD-10-CM | POA: Diagnosis not present

## 2017-05-13 DIAGNOSIS — Z23 Encounter for immunization: Secondary | ICD-10-CM

## 2017-05-13 DIAGNOSIS — Z0100 Encounter for examination of eyes and vision without abnormal findings: Secondary | ICD-10-CM | POA: Diagnosis not present

## 2017-05-13 NOTE — Progress Notes (Addendum)
[facility] [address], [address] [place], [place] [address] [phone] [phone]  Date:  05/13/2017   Name:  Randall Swanson   DOB:  Sep 25, 1975   MRN:  [id number]  PCP:  Pearline Cablesopland, Jessica C, MD    Chief Complaint: Follow-up (Pt here for 3 month f/u visit, )   History of Present Illness:  Randall Swanson is a 41 y.o. very pleasant male patient who presents with the following:  Here today for a follow-up visit I saw him as a new patient back in may of this year  Here today as a new patient to our office.  History of hyperlipidemia and HTN, GAD, smoker Most recent labs in epic are 41 years old  He has been a [facility] pt most recently but would like to change to getting his care here as his wife works here as a Associate Professorpharmacy tech.    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 Their middle daughter has CP, oldest child is 637 yo and has mild ADD.    States that he was dx with COPD in the past from x-ray, but his spirometry test was negative.  He is working on quitting smoking and plans to quit soon. He is down to 5-6 cigs a day. He does use albuterol on occasion for wheezing.  He also has allergies and uses flonase  His wife does notice that he snores at night and they are interested in a sleep apnea test for him. He does feel tired, but there could be other reasons for this as well- his work schedule and young children!  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   This will bother him more at night.  He does wear a brace some of the time at night.   He is right handed History of anxiety, he uses lexapro and xanax prn.  He would like to have this refilled- he uses this on occasion.   Notes that he rarely needs the xanax  NCCSR: no entries  He is on valsartan and HCTZ for HTN  His 4yo [facility] now; she just had bilateral  hip surgery. He had a little time off from work for her operation and wanted to come in for a visit while he was able to do so She has CP and had serious hip disease which had to be repaired finally He also has a 907 month old child   He ate over 6 hours ago- we will check his lipids today.  We started him on mevacor for this after last visit  Flu shot done already this year   He would like to do a tetanus booster- he did have a Tdap in 2009, he is about due and often gets cut or burned in his work as a Investment banker, operationalchef   He is handling current stressors ok, rarely needs to use his xanax He is still taking his lexapro Patient Active Problem List   Diagnosis Date Noted  . Lumbar degenerative disc disease 01/25/2017  . Numbness of right hand 01/25/2017  . Dyslipidemia 11/27/2016  . GAD (generalized anxiety disorder) 11/26/2016  . COPD, mild (HCC) 11/26/2016  . Snoring 11/26/2016  . Allergic rhinitis 11/26/2016  . Smoker 11/26/2016  . ESOPHAGEAL REFLUX 04/28/2011  . HYPERLIPIDEMIA 04/02/2011  .  HYPERTENSION 04/02/2011    Past Medical History:  Diagnosis Date  . Acid reflux   . COPD (chronic obstructive pulmonary disease) (HCC)   . Hypertension     Past Surgical History:  Procedure Laterality Date  . HERNIA REPAIR      Social History  Substance Use Topics  . Smoking status: Current Every Day Smoker  . Smokeless tobacco: Never Used  . Alcohol use No    Family History  Problem Relation Age of Onset  . Hypertension Mother   . Hypertension Father     No Known Allergies  Medication list has been reviewed and updated.  Current Outpatient Prescriptions on File Prior to Visit  Medication Sig Dispense Refill  . albuterol (PROVENTIL HFA;VENTOLIN HFA) 108 (90 Base) MCG/ACT inhaler Inhale 2 puffs into the lungs as needed. 1 Inhaler 9  . ALPRAZolam (XANAX) 0.5 MG tablet Take 0.5 tablet by mouth daily as needed 30 tablet 1  . escitalopram (LEXAPRO) 20 MG tablet Take 1 tablet (20 mg total)  by mouth daily. 90 tablet 3  . fluticasone (FLONASE) 50 MCG/ACT nasal spray Place 1 spray into both nostrils daily. 16 g 11  . hydrochlorothiazide (HYDRODIURIL) 25 MG tablet Take 1 tablet (25 mg total) by mouth daily. 90 tablet 3  . lovastatin (MEVACOR) 20 MG tablet Take 1 tablet (20 mg total) by mouth at bedtime. 90 tablet 3  . meloxicam (MOBIC) 15 MG tablet One tab PO qAM with breakfast for 2 weeks, then daily prn pain. 30 tablet 3  . valsartan (DIOVAN) 320 MG tablet Take 1 tablet (320 mg total) by mouth daily. 90 tablet 3   No current facility-administered medications on file prior to visit.     Review of Systems:  As per HPI- otherwise negative.   Physical Examination: Vitals:   05/13/17 1651  BP: 135/76  Pulse: (!) 55  Temp: 97.6 F (36.4 C)  SpO2: 97%   Vitals:   05/13/17 1651  Weight: 239 lb 9.6 oz (108.7 kg)  Height: 6\' 1"  (1.854 m)   Body mass index is 31.61 kg/m. Ideal Body Weight: Weight in (lb) to have BMI = 25: 189.1  GEN: WDWN, NAD, Non-toxic, A & O x 3, looks well, overweight HEENT: Atraumatic, Normocephalic. Neck supple. No masses, No LAD. Ears and Nose: No external deformity. CV: RRR, No M/G/R. No JVD. No thrill. No extra heart sounds. PULM: CTA B, no wheezes, crackles, rhonchi. No retractions. No resp. distress. No accessory muscle use. ABD: S, NT, ND EXTR: No c/c/e NEURO Normal gait.  PSYCH: Normally interactive. Conversant. Not depressed or anxious appearing.  Calm demeanor.   BP Readings from Last 3 Encounters:  05/13/17 135/76  03/23/17 129/75  02/22/17 113/74     Assessment and Plan: Hyperlipidemia, unspecified hyperlipidemia type - Plan: Lipid panel  Immunization due - Plan: Td vaccine greater than or equal to 7yo preservative free IM  Medication monitoring encounter - Plan: Basic metabolic panel  Essential hypertension  Follow-up visit today BP is ok Updated td Check BMP and lipids today  Signed Abbe Amsterdam, MD Received  his labs 10/28  Results for orders placed or performed in visit on 05/13/17  Lipid panel  Result Value Ref Range   Cholesterol 135 0 - 200 mg/dL   Triglycerides 161.0 (H) 0.0 - 149.0 mg/dL   HDL 96.04 (L) >54.09 mg/dL   VLDL 81.1 (H) 0.0 - 91.4 mg/dL   Total CHOL/HDL Ratio 4    NonHDL 103.94   Basic  metabolic panel  Result Value Ref Range   Sodium 138 135 - 145 mEq/L   Potassium 3.6 3.5 - 5.1 mEq/L   Chloride 102 96 - 112 mEq/L   CO2 29 19 - 32 mEq/L   Glucose, Bld 90 70 - 99 mg/dL   BUN 14 6 - 23 mg/dL   Creatinine, Ser 6.04 0.40 - 1.50 mg/dL   Calcium 9.5 8.4 - 54.0 mg/dL   GFR 981.19 >14.78 mL/min  LDL cholesterol, direct  Result Value Ref Range   Direct LDL 80.0 mg/dL

## 2017-05-13 NOTE — Patient Instructions (Signed)
It was very nice to see you today- I hope that your daughter is able to come home soon!  Please let me know if you need anything, and otherwise we can plan to check back in May of 2018 I will be in touch with your labs asap

## 2017-05-14 ENCOUNTER — Ambulatory Visit (INDEPENDENT_AMBULATORY_CARE_PROVIDER_SITE_OTHER): Payer: 59 | Admitting: Sports Medicine

## 2017-05-14 ENCOUNTER — Encounter: Payer: Self-pay | Admitting: Sports Medicine

## 2017-05-14 DIAGNOSIS — I10 Essential (primary) hypertension: Secondary | ICD-10-CM | POA: Diagnosis not present

## 2017-05-14 DIAGNOSIS — G5601 Carpal tunnel syndrome, right upper limb: Secondary | ICD-10-CM | POA: Diagnosis not present

## 2017-05-14 DIAGNOSIS — E669 Obesity, unspecified: Secondary | ICD-10-CM | POA: Diagnosis not present

## 2017-05-14 DIAGNOSIS — G4733 Obstructive sleep apnea (adult) (pediatric): Secondary | ICD-10-CM | POA: Diagnosis not present

## 2017-05-14 LAB — BASIC METABOLIC PANEL
BUN: 14 mg/dL (ref 6–23)
CALCIUM: 9.5 mg/dL (ref 8.4–10.5)
CO2: 29 meq/L (ref 19–32)
Chloride: 102 mEq/L (ref 96–112)
Creatinine, Ser: 0.69 mg/dL (ref 0.40–1.50)
GFR: 133.1 mL/min (ref >60.00)
Glucose, Bld: 90 mg/dL (ref 70–99)
Potassium: 3.6 mEq/L (ref 3.5–5.1)
SODIUM: 138 meq/L (ref 135–145)

## 2017-05-14 LAB — LIPID PANEL
CHOL/HDL RATIO: 4
Cholesterol: 135 mg/dL (ref 0–200)
HDL: 31.4 mg/dL — AB (ref >39.00)
NONHDL: 103.94
TRIGLYCERIDES: 241 mg/dL — AB (ref 0.0–149.0)
VLDL: 48.2 mg/dL — AB (ref 0.0–40.0)

## 2017-05-14 LAB — LDL CHOLESTEROL, DIRECT: Direct LDL: 80 mg/dL

## 2017-05-14 NOTE — Assessment & Plan Note (Addendum)
Continue with nighttime splinting, increased symptoms during furniture week. If persistent symptoms after an additional month of nighttime immobilization I am happy to do a median nerve hydrodissection at the level of the carpal tunnel. Rehab exercises given.

## 2017-05-14 NOTE — Progress Notes (Signed)
   Subjective:    [name] as a consultation for: Dr. Shanda BumpsJessica Swanson  CC: Hand numbness and tingling  HPI: This is a pleasant 41 year old male, he has a history of right hand paresthesias, suspicious for carpal tunnel syndrome, he did have a nerve conduction study/EMG several years ago, tells me his symptoms had just began so it may have been done too early, it did come back negative.  Furniture market just finished, he has been doing a lot of lifting and a lot of working and is having worsening of paresthesias in the first through the radial half of the fourth finger, worse at night.  Moderate, persistent.  Past medical history, Surgical history, Family history not pertinant except as noted below, Social history, Allergies, and medications have been entered into the medical record, reviewed, and no changes needed.   Review of Systems: No headache, visual changes, nausea, vomiting, diarrhea, constipation, dizziness, abdominal pain, skin rash, fevers, chills, night sweats, weight loss, swollen lymph nodes, body aches, joint swelling, muscle aches, chest pain, shortness of breath, mood changes, visual or auditory hallucinations.   Objective:   General: Well Developed, well nourished, and in no acute distress.  Neuro:  Extra-ocular muscles intact, able to move all 4 extremities, sensation grossly intact.  Deep tendon reflexes tested were normal. Psych: Alert and oriented, mood congruent with affect. ENT:  Ears and nose appear unremarkable.  Hearing grossly normal. Neck: Unremarkable overall appearance, trachea midline.  No visible thyroid enlargement. Eyes: Conjunctivae and lids appear unremarkable.  Pupils equal and round. Skin: Warm and dry, no rashes noted.  Cardiovascular: Pulses palpable, no extremity edema. Right wrist: Inspection normal with no visible erythema or swelling. ROM smooth and normal with good flexion and extension and ulnar/radial deviation that is  symmetrical with opposite wrist. Palpation is normal over metacarpals, navicular, lunate, and TFCC; tendons without tenderness/ swelling No snuffbox tenderness. No tenderness over Canal of Guyon. Strength 5/5 in all directions without pain. Positive Tinel's and Phalen signs. Negative Finkelstein sign. Negative Watson's test.  Impression and Recommendations:   This case required medical decision making of moderate complexity.  Carpal tunnel syndrome, right Continue with nighttime splinting, increased symptoms during furniture week. If persistent symptoms after an additional month of nighttime immobilization I am happy to do a median nerve hydrodissection at the level of the carpal tunnel. Rehab exercises given. ___________________________________________ Randall Swanson, M.D., ABFM., CAQSM. Primary Care and Sports Medicine [facility]  Adjunct Instructor of Family Medicine  [employer]

## 2017-06-09 DIAGNOSIS — G4733 Obstructive sleep apnea (adult) (pediatric): Secondary | ICD-10-CM | POA: Diagnosis not present

## 2017-06-14 ENCOUNTER — Ambulatory Visit: Payer: 59 | Admitting: Sports Medicine

## 2017-06-18 ENCOUNTER — Other Ambulatory Visit: Payer: Self-pay | Admitting: Family Medicine

## 2017-06-18 DIAGNOSIS — F411 Generalized anxiety disorder: Secondary | ICD-10-CM

## 2017-06-18 MED FILL — ESCITALOPRAM 20 MG TABLET: 20 | 90 days supply | Qty: 90 | Fill #2

## 2017-06-18 MED FILL — MELOXICAM 15 MG TABLET: 15 | 30 days supply | Qty: 30 | Fill #1

## 2017-06-18 MED FILL — HYDROCHLOROTHIAZIDE 25 MG T: 25 | 90 days supply | Qty: 90 | Fill #2

## 2017-06-18 MED FILL — VALSARTAN 320 MG TABLET: 320 | 90 days supply | Qty: 90 | Fill #2

## 2017-06-18 MED FILL — FLUTICASONE PROP 50 MCG SPR: 50 | 60 days supply | Qty: 16 | Fill #2

## 2017-06-18 MED FILL — LOVASTATIN 20 MG TABLET: 20 | 90 days supply | Qty: 90 | Fill #2

## 2017-06-22 NOTE — Telephone Encounter (Signed)
Requesting: ALPRAZolam (XANAX) 0.5 MG tablet Contract UDS Last OV: 05/13/2017 Last Refill: 11/26/2016  Please Advise

## 2017-07-09 DIAGNOSIS — G4733 Obstructive sleep apnea (adult) (pediatric): Secondary | ICD-10-CM | POA: Diagnosis not present

## 2017-07-17 DIAGNOSIS — G4733 Obstructive sleep apnea (adult) (pediatric): Secondary | ICD-10-CM | POA: Diagnosis not present

## 2017-08-09 DIAGNOSIS — G4733 Obstructive sleep apnea (adult) (pediatric): Secondary | ICD-10-CM | POA: Diagnosis not present

## 2017-09-06 DIAGNOSIS — G4733 Obstructive sleep apnea (adult) (pediatric): Secondary | ICD-10-CM | POA: Diagnosis not present

## 2017-09-06 DIAGNOSIS — I10 Essential (primary) hypertension: Secondary | ICD-10-CM | POA: Diagnosis not present

## 2017-09-06 DIAGNOSIS — Z9989 Dependence on other enabling machines and devices: Secondary | ICD-10-CM | POA: Diagnosis not present

## 2017-09-06 DIAGNOSIS — E669 Obesity, unspecified: Secondary | ICD-10-CM | POA: Diagnosis not present

## 2017-09-24 MED FILL — FLUTICASONE PROP 50 MCG SPR: 50 | 90 days supply | Qty: 48 | Fill #3

## 2017-09-24 MED FILL — LOVASTATIN 20 MG TABLET: 20 | 90 days supply | Qty: 90 | Fill #3

## 2017-09-24 MED FILL — MELOXICAM 15 MG TABLET: 15 | 30 days supply | Qty: 30 | Fill #2

## 2017-09-24 MED FILL — VALSARTAN 320 MG TAB: 320 | 90 days supply | Qty: 90 | Fill #3

## 2017-09-24 MED FILL — HYDROCHLOROTHIAZIDE 25 MG T: 25 | 90 days supply | Qty: 90 | Fill #3

## 2017-09-24 MED FILL — ESCITALOPRAM 20 MG TABLET: 20 | 90 days supply | Qty: 90 | Fill #3

## 2017-10-27 DIAGNOSIS — G4733 Obstructive sleep apnea (adult) (pediatric): Secondary | ICD-10-CM | POA: Diagnosis not present

## 2017-12-22 ENCOUNTER — Other Ambulatory Visit: Payer: Self-pay | Admitting: Family Medicine

## 2017-12-22 DIAGNOSIS — F411 Generalized anxiety disorder: Secondary | ICD-10-CM

## 2017-12-22 DIAGNOSIS — E785 Hyperlipidemia, unspecified: Secondary | ICD-10-CM

## 2017-12-22 DIAGNOSIS — I10 Essential (primary) hypertension: Secondary | ICD-10-CM

## 2017-12-22 MED ORDER — ESCITALOPRAM OXALATE 20 MG PO TABS: 20.0000 mg | ORAL_TABLET | Freq: Every day | ORAL | 0 refills | Status: DC

## 2017-12-22 MED ORDER — VALSARTAN 320 MG PO TABS: 320.0000 mg | ORAL_TABLET | Freq: Every day | ORAL | 0 refills | Status: DC

## 2017-12-22 MED ORDER — LOVASTATIN 20 MG PO TABS: 20.0000 mg | ORAL_TABLET | Freq: Every day | ORAL | 0 refills | Status: DC

## 2017-12-22 MED FILL — LOVASTATIN 20 MG TABLET: 20 | 30 days supply | Qty: 30 | Fill #0

## 2017-12-22 MED FILL — ESCITALOPRAM 20 MG TABLET: 20 | 30 days supply | Qty: 30 | Fill #0

## 2017-12-22 MED FILL — VALSARTAN 320 MG TABS: 320 | 30 days supply | Qty: 30 | Fill #0

## 2017-12-22 NOTE — Telephone Encounter (Signed)
Rx has been sent in. 

## 2017-12-22 NOTE — Telephone Encounter (Signed)
Copied from CRM [id number]. Topic: Quick Communication - Rx Refill/Question >> Dec 22, 2017  2:20 PM Crist InfanteHarrald, Kathy J wrote: Medication: escitalopram (LEXAPRO) 20 MG tablet valsartan (DIOVAN) 320 MG tablet lovastatin (MEVACOR) 20 MG tablet  Pt has appt (cpe) on 6/13, but needing enough to get him through to this appt [facility] [place], [place] - [id number] [phone] ([phone] (Fax)

## 2017-12-26 NOTE — Progress Notes (Addendum)
[facility] [address], [address] [place], [place] [address] [phone]  Date:  12/30/2017   Name:  Randall Swanson   DOB:  [birth year]   MRN:  [id number]  PCP:  Randall Cables, MD    Chief Complaint: Annual Exam (no concerns)   History of Present Illness:  Randall Swanson is a 42 y.o. 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 His daughters are doing well- he has the 2 youngest for a few days as his wife and oldest went to [place] for a graduation  He is fasting today for labs He uses xanax prn, but does not use it too often- does not need a refill todya He feels like his lexapro is working well His breathing is ok, stable He is not really able to identify any triggers as far as his wheezing sx  He tries to use his CPAP, but sometimes use get interrupted by life   BP Readings from Last 3 Encounters:  12/30/17 128/76  05/14/17 116/68  05/13/17 135/76   He is quite active at work, but he realized that he needs to try and get other exercise as well- he does his best  No CP or unusual SOB He is not aware of any family history of prostate cancer or colon cancer  He will use mobic prn, but does not need this refilledright now Uses as needed for CTS and his back   Wt Readings from Last 3 Encounters:  12/30/17 235 lb 3.2 oz (106.7 kg)  05/14/17 238 lb (108 kg)  05/13/17 239 lb 9.6 oz (108.7 kg)   No skin changes or concerns No genital concerns No digestive issues No urinary concerns   Patient Active Problem List   Diagnosis Date Noted  . Lumbar degenerative disc disease 01/25/2017  . Carpal tunnel syndrome, right 01/25/2017  . Dyslipidemia 11/27/2016  . GAD (generalized anxiety  disorder) 11/26/2016  . COPD, mild (HCC) 11/26/2016  . Snoring 11/26/2016  . Allergic rhinitis 11/26/2016  . Smoker 11/26/2016  . ESOPHAGEAL REFLUX 04/28/2011  . HYPERTENSION 04/02/2011    Past Medical History:  Diagnosis Date  . Acid reflux   . COPD (chronic obstructive pulmonary disease) (HCC)   . Hypertension     Past Surgical History:  Procedure Laterality Date  . HERNIA REPAIR      Social History   Tobacco Use  . Smoking status: Current Every Day Smoker  . Smokeless tobacco: Never Used  Substance Use Topics  . Alcohol use: No  . Drug use: No    Family History  Problem Relation Age of Onset  . Hypertension Mother   . Hypertension Father     No Known Allergies  Medication list has been reviewed and updated.  Current Outpatient Medications on File Prior to Visit  Medication Sig Dispense Refill  . ALPRAZolam (XANAX) 0.5 MG tablet TAKE 1/2 TABLET BY MOUTH EVERYDAY AS NEEDED FOR ANXIETY 30 tablet 1  . fluticasone (FLONASE) 50 MCG/ACT nasal spray Place 1 spray into both nostrils daily. 16 g 11  . meloxicam (MOBIC) 15 MG tablet One tab PO qAM with breakfast for 2 weeks, then daily prn pain. 30 tablet 3   No  current facility-administered medications on file prior to visit.     Review of Systems:  As per HPI- otherwise negative.   Physical Examination: Vitals:   12/30/17 1436  BP: 128/76  Pulse: (!) 57  Resp: 16  Temp: 98.1 F (36.7 C)  SpO2: 97%   Vitals:   12/30/17 1436  Weight: 235 lb 3.2 oz (106.7 kg)  Height: 6' (1.829 m)   Body mass index is 31.9 kg/m. Ideal Body Weight: Weight in (lb) to have BMI = 25: 183.9  GEN: WDWN, NAD, Non-toxic, A & O x 3, tall build, overweight, looks well  HEENT: Atraumatic, Normocephalic. Neck supple. No masses, No LAD. Bilateral TM wnl, oropharynx normal.  PEERL,EOMI.   Ears and Nose: No external deformity. CV: RRR, No M/G/R. No JVD. No thrill. No extra heart sounds. PULM: CTA B, no wheezes, crackles, rhonchi.  No retractions. No resp. distress. No accessory muscle use. ABD: S, NT, ND EXTR: No c/c/e NEURO Normal gait.  PSYCH: Normally interactive. Conversant. Not depressed or anxious appearing.  Calm demeanor.   Pulse Readings from Last 3 Encounters:  12/30/17 (!) 57  05/14/17 66  05/13/17 (!) 55    Assessment and Plan: Physical exam  Hyperlipidemia, unspecified hyperlipidemia type - Plan: Lipid panel  Essential hypertension - Plan: CBC, hydrochlorothiazide (HYDRODIURIL) 25 MG tablet, valsartan (DIOVAN) 320 MG tablet  GAD (generalized anxiety disorder) - Plan: escitalopram (LEXAPRO) 20 MG tablet  COPD, mild (HCC) - Plan: albuterol (PROVENTIL HFA;VENTOLIN HFA) 108 (90 Base) MCG/ACT inhaler  Screening for diabetes mellitus - Plan: Comprehensive metabolic panel, Hemoglobin A1c  Dyslipidemia - Plan: lovastatin (MEVACOR) 20 MG tablet  CPE and labs today Health maint is UTD Encouraged exercise  Will plan further follow- up pending labs.   Signed Abbe AmsterdamJessica Nikolaj Geraghty, MD  Received his labs 6/15 and called him Labs are ok except lipid profile is not at goal- will change to crestor Otherwise ok, copy of labs to pt Results for orders placed or performed in visit on 12/30/17  CBC  Result Value Ref Range   WBC 8.7 4.0 - 10.5 K/uL   RBC 5.02 4.22 - 5.81 Mil/uL   Platelets 228.0 150.0 - 400.0 K/uL   Hemoglobin 15.2 13.0 - 17.0 g/dL   HCT 78.243.3 95.639.0 - 21.352.0 %   MCV 86.3 78.0 - 100.0 fl   MCHC 35.0 30.0 - 36.0 g/dL   RDW 08.613.0 57.811.5 - 46.915.5 %  Comprehensive metabolic panel  Result Value Ref Range   Sodium 138 135 - 145 mEq/L   Potassium 3.5 3.5 - 5.1 mEq/L   Chloride 101 96 - 112 mEq/L   CO2 27 19 - 32 mEq/L   Glucose, Bld 81 70 - 99 mg/dL   BUN 15 6 - 23 mg/dL   Creatinine, Ser 6.290.82 0.40 - 1.50 mg/dL   Total Bilirubin 0.5 0.2 - 1.2 mg/dL   Alkaline Phosphatase 55 39 - 117 U/L   AST 23 0 - 37 U/L   ALT 30 0 - 53 U/L   Total Protein 7.0 6.0 - 8.3 g/dL   Albumin 4.6 3.5 - 5.2 g/dL    Calcium 9.8 8.4 - 52.810.5 mg/dL   GFR 413.24108.74 >40.10>60.00 mL/min  Hemoglobin A1c  Result Value Ref Range   Hgb A1c MFr Bld 6.0 4.6 - 6.5 %  Lipid panel  Result Value Ref Range   Cholesterol 165 0 - 200 mg/dL   Triglycerides 272.5307.0 (H) 0.0 - 149.0 mg/dL   HDL 36.6436.40 (L) >40.34>39.00 mg/dL  VLDL 61.4 (H) 0.0 - 40.0 mg/dL   Total CHOL/HDL Ratio 5    NonHDL 128.65   LDL cholesterol, direct  Result Value Ref Range   Direct LDL 96.0 mg/dL

## 2017-12-30 ENCOUNTER — Encounter: Payer: Self-pay | Admitting: Family Medicine

## 2017-12-30 ENCOUNTER — Ambulatory Visit (INDEPENDENT_AMBULATORY_CARE_PROVIDER_SITE_OTHER): Payer: 59 | Admitting: Family Medicine

## 2017-12-30 VITALS — BP 128/76 | HR 57 | Temp 98.1°F | Resp 16 | Ht 72.0 in | Wt 235.2 lb

## 2017-12-30 DIAGNOSIS — I10 Essential (primary) hypertension: Secondary | ICD-10-CM | POA: Diagnosis not present

## 2017-12-30 DIAGNOSIS — Z131 Encounter for screening for diabetes mellitus: Secondary | ICD-10-CM

## 2017-12-30 DIAGNOSIS — Z0000 Encounter for general adult medical examination without abnormal findings: Secondary | ICD-10-CM

## 2017-12-30 DIAGNOSIS — E785 Hyperlipidemia, unspecified: Secondary | ICD-10-CM

## 2017-12-30 DIAGNOSIS — J449 Chronic obstructive pulmonary disease, unspecified: Secondary | ICD-10-CM | POA: Diagnosis not present

## 2017-12-30 DIAGNOSIS — F411 Generalized anxiety disorder: Secondary | ICD-10-CM

## 2017-12-30 MED ORDER — ALBUTEROL SULFATE HFA 108 (90 BASE) MCG/ACT IN AERS: 2.0000 | INHALATION_SPRAY | RESPIRATORY_TRACT | 9 refills | Status: DC | PRN

## 2017-12-30 MED ORDER — HYDROCHLOROTHIAZIDE 25 MG PO TABS: 25.0000 mg | ORAL_TABLET | Freq: Every day | ORAL | 3 refills | Status: DC

## 2017-12-30 MED ORDER — ESCITALOPRAM OXALATE 20 MG PO TABS: 20.0000 mg | ORAL_TABLET | Freq: Every day | ORAL | 3 refills | Status: DC

## 2017-12-30 MED ORDER — VALSARTAN 320 MG PO TABS: 320.0000 mg | ORAL_TABLET | Freq: Every day | ORAL | 3 refills | Status: DC

## 2017-12-30 MED ORDER — LOVASTATIN 20 MG PO TABS: 20.0000 mg | ORAL_TABLET | Freq: Every day | ORAL | 3 refills | Status: DC

## 2017-12-30 NOTE — Patient Instructions (Signed)
Great to see you today- have a wonderful summer!  I'll be in touch with your labs asap   Health Maintenance, Male A healthy lifestyle and preventive care is important for your health and wellness. Ask your health care provider about what schedule of regular examinations is right for you. What should I know about weight and diet? Eat a Healthy Diet  Eat plenty of vegetables, fruits, whole grains, low-fat dairy products, and lean protein.  Do not eat a lot of foods high in solid fats, added sugars, or salt.  Maintain a Healthy Weight Regular exercise can help you achieve or maintain a healthy weight. You should:  Do at least 150 minutes of exercise each week. The exercise should increase your heart rate and make you sweat (moderate-intensity exercise).  Do strength-training exercises at least twice a week.  Watch Your Levels of Cholesterol and Blood Lipids  Have your blood tested for lipids and cholesterol every 5 years starting at 44 years of age. If you are at high risk for heart disease, you should start having your blood tested when you are 44 years old. You may need to have your cholesterol levels checked more often if: ? Your lipid or cholesterol levels are high. ? You are older than 44 years of age. ? You are at high risk for heart disease.  What should I know about cancer screening? Many types of cancers can be detected early and may often be prevented. Lung Cancer  You should be screened every year for lung cancer if: ? You are a current smoker who has smoked for at least 30 years. ? You are a former smoker who has quit within the past 15 years.  Talk to your health care provider about your screening options, when you should start screening, and how often you should be screened.  Colorectal Cancer  Routine colorectal cancer screening usually begins at 44 years of age and should be repeated every 5-10 years until you are 44 years old. You may need to be screened more often  if early forms of precancerous polyps or small growths are found. Your health care provider may recommend screening at an earlier age if you have risk factors for colon cancer.  Your health care provider may recommend using home test kits to check for hidden blood in the stool.  A small camera at the end of a tube can be used to examine your colon (sigmoidoscopy or colonoscopy). This checks for the earliest forms of colorectal cancer.  Prostate and Testicular Cancer  Depending on your age and overall health, your health care provider may do certain tests to screen for prostate and testicular cancer.  Talk to your health care provider about any symptoms or concerns you have about testicular or prostate cancer.  Skin Cancer  Check your skin from head to toe regularly.  Tell your health care provider about any new moles or changes in moles, especially if: ? There is a change in a mole's size, shape, or color. ? You have a mole that is larger than a pencil eraser.  Always use sunscreen. Apply sunscreen liberally and repeat throughout the day.  Protect yourself by wearing long sleeves, pants, a wide-brimmed hat, and sunglasses when outside.  What should I know about heart disease, diabetes, and high blood pressure?  If you are 6818-439 years of age, have your blood pressure checked every 3-5 years. If you are 44 years of age or older, have your blood pressure checked every  year. You should have your blood pressure measured twice-once when you are at a hospital or clinic, and once when you are not at a hospital or clinic. Record the average of the two measurements. To check your blood pressure when you are not at a hospital or clinic, you can use: ? An automated blood pressure machine at a pharmacy. ? A home blood pressure monitor.  Talk to your health care provider about your target blood pressure.  If you are between 76-18 years old, ask your health care provider if you should take aspirin  to prevent heart disease.  Have regular diabetes screenings by checking your fasting blood sugar level. ? If you are at a normal weight and have a low risk for diabetes, have this test once every three years after the age of 47. ? If you are overweight and have a high risk for diabetes, consider being tested at a younger age or more often.  A one-time screening for abdominal aortic aneurysm (AAA) by ultrasound is recommended for men aged 1-75 years who are current or former smokers. What should I know about preventing infection? Hepatitis B If you have a higher risk for hepatitis B, you should be screened for this virus. Talk with your health care provider to find out if you are at risk for hepatitis B infection. Hepatitis C Blood testing is recommended for:  Everyone born from [birth year] through 1965.  Anyone with known risk factors for hepatitis C.  Sexually Transmitted Diseases (STDs)  You should be screened each year for STDs including gonorrhea and chlamydia if: ? You are sexually active and are younger than 44 years of age. ? You are older than 44 years of age and your health care provider tells you that you are at risk for this type of infection. ? Your sexual activity has changed since you were last screened and you are at an increased risk for chlamydia or gonorrhea. Ask your health care provider if you are at risk.  Talk with your health care provider about whether you are at high risk of being infected with HIV. Your health care provider may recommend a prescription medicine to help prevent HIV infection.  What else can I do?  Schedule regular health, dental, and eye exams.  Stay current with your vaccines (immunizations).  Do not use any tobacco products, such as cigarettes, chewing tobacco, and e-cigarettes. If you need help quitting, ask your health care provider.  Limit alcohol intake to no more than 2 drinks per day. One drink equals 12 ounces of beer, 5 ounces of wine,  or 1 ounces of hard liquor.  Do not use street drugs.  Do not share needles.  Ask your health care provider for help if you need support or information about quitting drugs.  Tell your health care provider if you often feel depressed.  Tell your health care provider if you have ever been abused or do not feel safe at home. This information is not intended to replace advice given to you by your health care provider. Make sure you discuss any questions you have with your health care provider. Document Released: 01/02/2008 Document Revised: 03/04/2016 Document Reviewed: 04/09/2015 Elsevier Interactive Patient Education  [employer].

## 2017-12-31 LAB — LIPID PANEL
CHOL/HDL RATIO: 5
Cholesterol: 165 mg/dL (ref 0–200)
HDL: 36.4 mg/dL — ABNORMAL LOW (ref >39.00)
NONHDL: 128.65
Triglycerides: 307 mg/dL — ABNORMAL HIGH (ref 0.0–149.0)
VLDL: 61.4 mg/dL — ABNORMAL HIGH (ref 0.0–40.0)

## 2017-12-31 LAB — CBC
HEMATOCRIT: 43.3 % (ref 39.0–52.0)
Hemoglobin: 15.2 g/dL (ref 13.0–17.0)
MCHC: 35 g/dL (ref 30.0–36.0)
MCV: 86.3 fl (ref 78.0–100.0)
Platelets: 228 10*3/uL (ref 150.0–400.0)
RBC: 5.02 Mil/uL (ref 4.22–5.81)
RDW: 13 % (ref 11.5–15.5)
WBC: 8.7 10*3/uL (ref 4.0–10.5)

## 2017-12-31 LAB — COMPREHENSIVE METABOLIC PANEL
ALBUMIN: 4.6 g/dL (ref 3.5–5.2)
ALT: 30 U/L (ref 0–53)
AST: 23 U/L (ref 0–37)
Alkaline Phosphatase: 55 U/L (ref 39–117)
BUN: 15 mg/dL (ref 6–23)
CHLORIDE: 101 meq/L (ref 96–112)
CO2: 27 meq/L (ref 19–32)
Calcium: 9.8 mg/dL (ref 8.4–10.5)
Creatinine, Ser: 0.82 mg/dL (ref 0.40–1.50)
GFR: 108.74 mL/min (ref >60.00)
Glucose, Bld: 81 mg/dL (ref 70–99)
POTASSIUM: 3.5 meq/L (ref 3.5–5.1)
SODIUM: 138 meq/L (ref 135–145)
Total Bilirubin: 0.5 mg/dL (ref 0.2–1.2)
Total Protein: 7 g/dL (ref 6.0–8.3)

## 2017-12-31 LAB — HEMOGLOBIN A1C: HEMOGLOBIN A1C: 6 % (ref 4.6–6.5)

## 2017-12-31 LAB — LDL CHOLESTEROL, DIRECT: LDL DIRECT: 96 mg/dL

## 2018-01-01 MED ORDER — ROSUVASTATIN CALCIUM 20 MG PO TABS: 20.0000 mg | ORAL_TABLET | Freq: Every day | ORAL | 3 refills | Status: DC

## 2018-01-01 NOTE — Addendum Note (Signed)
Addended by: Abbe AmsterdamOPLAND, JESSICA C on: 01/01/2018 02:01 PM   Modules accepted: Orders

## 2018-01-03 MED FILL — ROSUVASTATIN CALCIUM 20 MG: 20 | 90 days supply | Qty: 90 | Fill #0

## 2018-01-10 DIAGNOSIS — G4733 Obstructive sleep apnea (adult) (pediatric): Secondary | ICD-10-CM | POA: Diagnosis not present

## 2018-01-10 DIAGNOSIS — Z9989 Dependence on other enabling machines and devices: Secondary | ICD-10-CM | POA: Diagnosis not present

## 2018-01-15 ENCOUNTER — Other Ambulatory Visit: Payer: Self-pay

## 2018-01-15 ENCOUNTER — Encounter (HOSPITAL_BASED_OUTPATIENT_CLINIC_OR_DEPARTMENT_OTHER): Payer: Self-pay | Admitting: Emergency Medicine

## 2018-01-15 ENCOUNTER — Emergency Department (HOSPITAL_BASED_OUTPATIENT_CLINIC_OR_DEPARTMENT_OTHER)
Admission: EM | Admit: 2018-01-15 | Discharge: 2018-01-15 | Disposition: A | Discharge status: Home / Self Care | Payer: 59 | Attending: Emergency Medicine | Admitting: Emergency Medicine

## 2018-01-15 DIAGNOSIS — I10 Essential (primary) hypertension: Secondary | ICD-10-CM | POA: Diagnosis not present

## 2018-01-15 DIAGNOSIS — S161XXA Strain of muscle, fascia and tendon at neck level, initial encounter: Secondary | ICD-10-CM | POA: Insufficient documentation

## 2018-01-15 DIAGNOSIS — T148XXA Other injury of unspecified body region, initial encounter: Secondary | ICD-10-CM

## 2018-01-15 DIAGNOSIS — J449 Chronic obstructive pulmonary disease, unspecified: Secondary | ICD-10-CM | POA: Insufficient documentation

## 2018-01-15 DIAGNOSIS — S199XXA Unspecified injury of neck, initial encounter: Secondary | ICD-10-CM | POA: Diagnosis present

## 2018-01-15 DIAGNOSIS — F17210 Nicotine dependence, cigarettes, uncomplicated: Secondary | ICD-10-CM | POA: Insufficient documentation

## 2018-01-15 DIAGNOSIS — M25511 Pain in right shoulder: Secondary | ICD-10-CM | POA: Diagnosis not present

## 2018-01-15 DIAGNOSIS — Y999 Unspecified external cause status: Secondary | ICD-10-CM | POA: Insufficient documentation

## 2018-01-15 DIAGNOSIS — M542 Cervicalgia: Secondary | ICD-10-CM | POA: Diagnosis not present

## 2018-01-15 DIAGNOSIS — Y929 Unspecified place or not applicable: Secondary | ICD-10-CM | POA: Insufficient documentation

## 2018-01-15 DIAGNOSIS — Z79899 Other long term (current) drug therapy: Secondary | ICD-10-CM | POA: Diagnosis not present

## 2018-01-15 DIAGNOSIS — Y939 Activity, unspecified: Secondary | ICD-10-CM | POA: Insufficient documentation

## 2018-01-15 MED ORDER — METHOCARBAMOL 500 MG PO TABS: 500.0000 mg | ORAL_TABLET | Freq: Two times a day (BID) | ORAL | 0 refills | Status: DC

## 2018-01-15 NOTE — ED Notes (Signed)
ED Provider at bedside. 

## 2018-01-15 NOTE — Discharge Instructions (Signed)
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
## 2018-01-17 MED FILL — VALSARTAN 320 MG TAB: 320 | 90 days supply | Qty: 90 | Fill #0

## 2018-01-17 MED FILL — HYDROCHLOROTHIAZIDE 25 MG T: 25 | 90 days supply | Qty: 90 | Fill #0

## 2018-01-17 MED FILL — ESCITALOPRAM 20 MG TABLET: 20 | 90 days supply | Qty: 90 | Fill #0

## 2018-02-04 DIAGNOSIS — G4733 Obstructive sleep apnea (adult) (pediatric): Secondary | ICD-10-CM | POA: Diagnosis not present

## 2018-03-22 ENCOUNTER — Other Ambulatory Visit: Payer: Self-pay | Admitting: Family Medicine

## 2018-03-22 DIAGNOSIS — J309 Allergic rhinitis, unspecified: Secondary | ICD-10-CM

## 2018-03-23 MED FILL — FLUTICASONE PROP 50 MCG SPR: 50 | 60 days supply | Qty: 16 | Fill #0

## 2018-04-28 MED FILL — HYDROCHLOROTHIAZIDE 25 MG T: 25 | 90 days supply | Qty: 90 | Fill #1

## 2018-04-28 MED FILL — VALSARTAN 320 MG TAB: 320 | 90 days supply | Qty: 90 | Fill #1

## 2018-04-28 MED FILL — ROSUVASTATIN CALCIUM 20 MG: 20 | 90 days supply | Qty: 90 | Fill #1

## 2018-04-28 MED FILL — ESCITALOPRAM 20 MG TABLET: 20 | 90 days supply | Qty: 90 | Fill #1

## 2018-05-09 DIAGNOSIS — G4733 Obstructive sleep apnea (adult) (pediatric): Secondary | ICD-10-CM | POA: Diagnosis not present

## 2018-05-30 IMAGING — DX DG LUMBAR SPINE COMPLETE 4+V
5 series · 5 of 5 positions shown · non-contrast
Comparison: None.

CLINICAL DATA: Chronic low back pain. New left thigh numbness
beginning 3 days ago.

EXAM:
LUMBAR SPINE - COMPLETE 4+ VIEW

[l-spine ap]
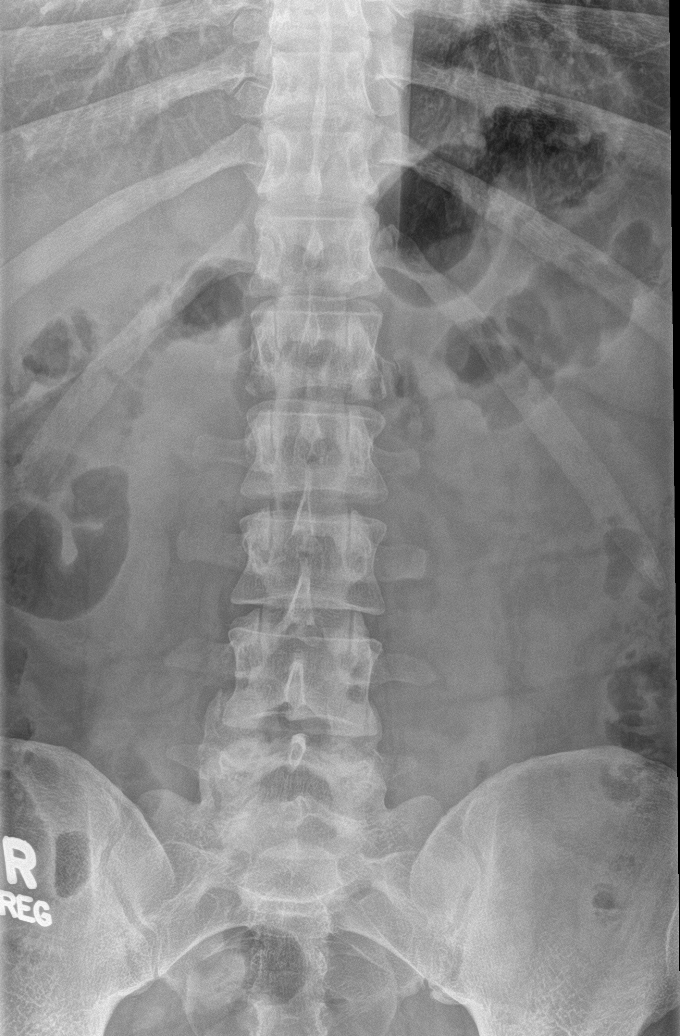

[l-spine obl (1 of 2)]
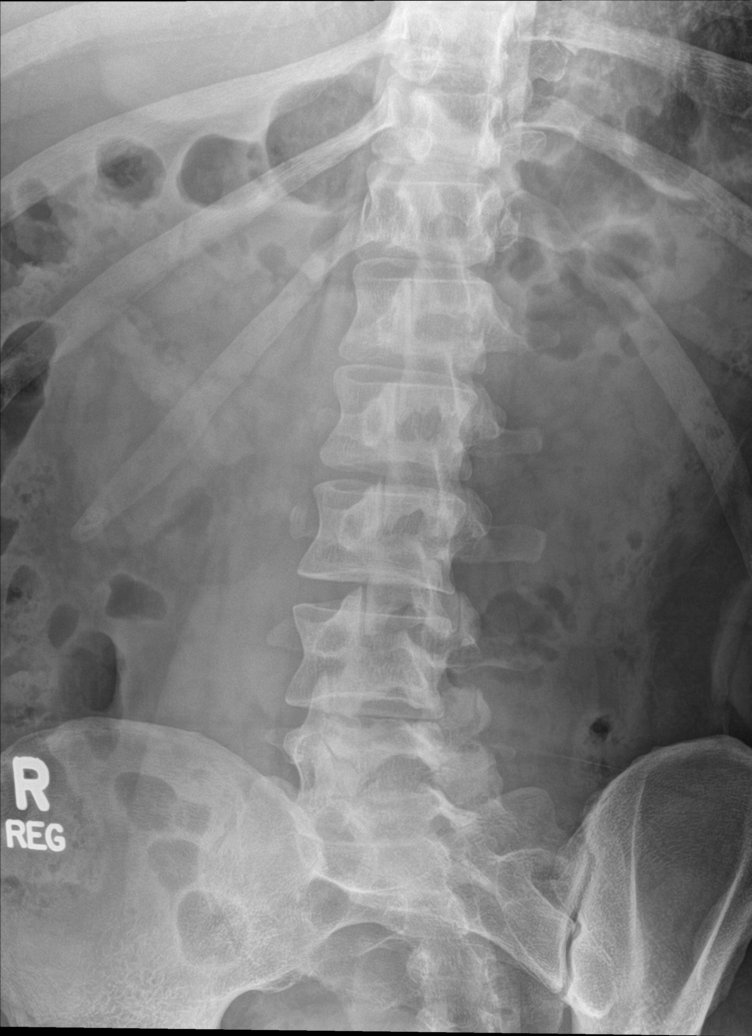

[l-spine obl (2 of 2)]
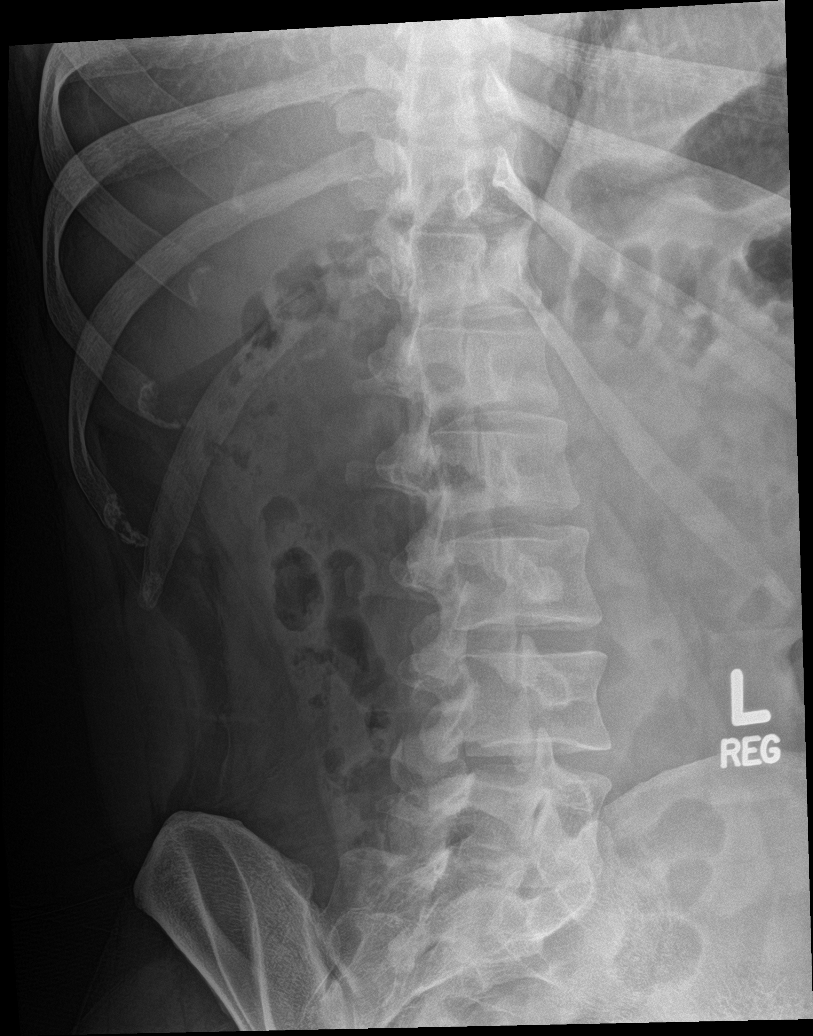

[l-spine lat]
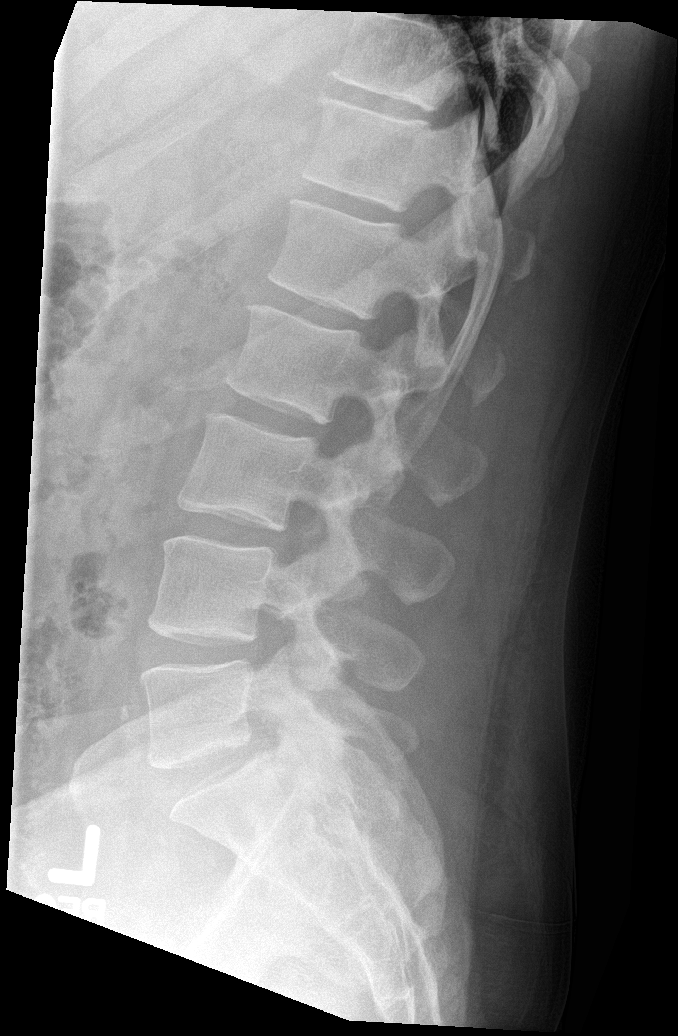

[l-spine spot]
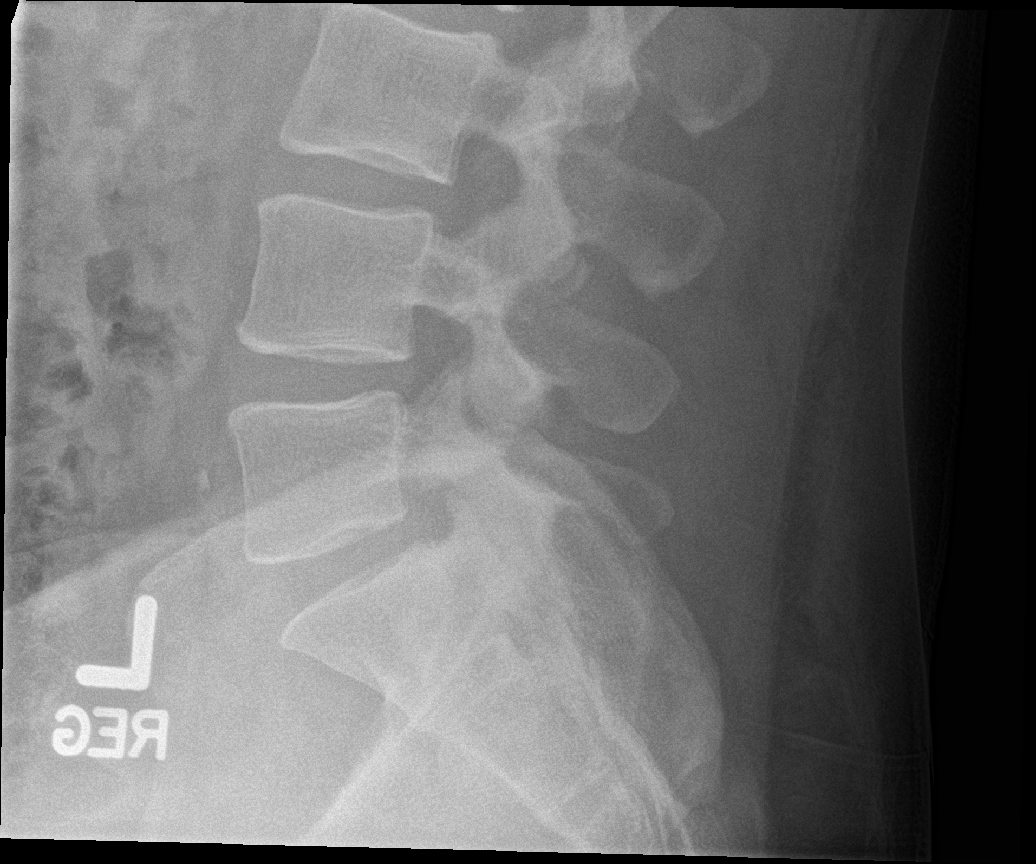

[5 of 5 positions shown; findings below may reference images not displayed]

FINDINGS: There is no evidence of lumbar spine fracture. Alignment is normal.
Intervertebral disc spaces are maintained. No significant facet
arthropathy or other bone lesions identified.
IMPRESSION: Negative.

## 2018-06-28 MED FILL — FLUTICASONE PROP 50 MCG SPR: 50 | 60 days supply | Qty: 16 | Fill #1

## 2018-08-02 MED FILL — HYDROCHLOROTHIAZIDE 25 MG T: 25 | 90 days supply | Qty: 90 | Fill #2

## 2018-08-02 MED FILL — ROSUVASTATIN CALCIUM 20 MG: 20 | 90 days supply | Qty: 90 | Fill #2

## 2018-08-02 MED FILL — ESCITALOPRAM 20 MG TABLET: 20 | 90 days supply | Qty: 90 | Fill #2

## 2018-08-03 ENCOUNTER — Telehealth: Payer: Self-pay

## 2018-08-03 MED ORDER — LOSARTAN POTASSIUM 100 MG PO TABS: 100.0000 mg | ORAL_TABLET | Freq: Every day | ORAL | 3 refills | Status: DC

## 2018-08-03 MED FILL — LOSARTAN POTASSIUM 100 MG T: 100 | 90 days supply | Qty: 90 | Fill #0

## 2018-08-03 NOTE — Telephone Encounter (Signed)
Will sub losartan 100 mg for now.  Called pt and let him know

## 2018-08-03 NOTE — Addendum Note (Signed)
Addended by: Abbe Amsterdam C on: 08/03/2018 05:29 PM   Modules accepted: Orders

## 2018-08-03 NOTE — Telephone Encounter (Signed)
Received fax from pharmacy stating patient's Valsartan 320 mg tablets are on back order. Please advise on alternative therapy.

## 2018-09-06 MED FILL — FLUTICASONE PROP 50 MCG SPR: 50 | 60 days supply | Qty: 16 | Fill #2

## 2018-09-30 MED FILL — VENTOLIN HFA 90 MCG INHALER: 108 (90 BAS | 17 days supply | Qty: 18 | Fill #0

## 2018-11-10 MED FILL — HYDROCHLOROTHIAZIDE 25 MG T: 25 | 90 days supply | Qty: 90 | Fill #3

## 2018-11-11 MED FILL — ESCITALOPRAM 20 MG TABLET: 20 | 90 days supply | Qty: 90 | Fill #3

## 2018-11-11 MED FILL — VALSARTAN 320 MG TAB: 320 | 90 days supply | Qty: 90 | Fill #2

## 2018-11-11 MED FILL — ROSUVASTATIN CALCIUM 20 MG: 20 | 90 days supply | Qty: 90 | Fill #3

## 2018-11-11 MED FILL — VENTOLIN HFA 90 MCG INHALER: 108 (90 BAS | 17 days supply | Qty: 18 | Fill #1

## 2018-11-11 MED FILL — FLUTICASONE PROP 50 MCG SPR: 50 | 60 days supply | Qty: 16 | Fill #3

## 2018-12-20 MED FILL — ALBUTEROL SULFATE HFA 108 (: 108 (90 BAS | 17 days supply | Qty: 18 | Fill #2

## 2018-12-28 ENCOUNTER — Telehealth: Payer: Self-pay | Admitting: General Practice

## 2018-12-28 ENCOUNTER — Telehealth: Payer: Self-pay

## 2018-12-28 ENCOUNTER — Other Ambulatory Visit: Payer: 59

## 2018-12-28 ENCOUNTER — Telehealth: Payer: Self-pay | Admitting: Family Medicine

## 2018-12-28 DIAGNOSIS — Z20822 Contact with and (suspected) exposure to covid-19: Secondary | ICD-10-CM

## 2018-12-28 DIAGNOSIS — R6889 Other general symptoms and signs: Secondary | ICD-10-CM | POA: Diagnosis not present

## 2018-12-28 NOTE — Telephone Encounter (Signed)
Pt has been scheduled for Covid-19 testing.   Scheduled with pt directly at [facility].   Pt was referred by: Lamar Blinks MD.

## 2018-12-28 NOTE — Telephone Encounter (Signed)
Desires to be tested for Covid19 due to his special needs child's caretaker's husband has tested positive. His wife works for [employer] and is being tested today. Feels he need this to protect child's health.Denies any travels.Denies all symptoms.He and his wife are quarantining for now. Routing to PCP.

## 2018-12-28 NOTE — Addendum Note (Signed)
Addended by: Denman George on: 12/28/2018 11:52 AM   Modules accepted: Orders

## 2018-12-28 NOTE — Telephone Encounter (Signed)
Patient called the office to state that his daughter who is special needs' caregiver has tested positive for covid. The caregiver has been with his daughter 10 hours out of the day. Due to exposure he would like to be tested for covid for the safety of his daughter. Routed to scheduling pool.

## 2018-12-28 NOTE — Telephone Encounter (Addendum)
Pt has been scheduled for Covid-19 testing.  °

## 2018-12-29 DIAGNOSIS — G4733 Obstructive sleep apnea (adult) (pediatric): Secondary | ICD-10-CM | POA: Diagnosis not present

## 2018-12-29 LAB — NOVEL CORONAVIRUS, NAA: SARS-CoV-2, NAA: NOT DETECTED

## 2019-01-06 ENCOUNTER — Other Ambulatory Visit: Payer: Self-pay | Admitting: Family Medicine

## 2019-01-06 DIAGNOSIS — J449 Chronic obstructive pulmonary disease, unspecified: Secondary | ICD-10-CM

## 2019-01-06 MED FILL — ALBUTEROL SULFATE HFA 108 (: 108 (90 BAS | 17 days supply | Qty: 18 | Fill #0

## 2019-01-06 MED FILL — FLUTICASONE PROP 50 MCG SPR: 50 | 60 days supply | Qty: 16 | Fill #4

## 2019-02-17 ENCOUNTER — Other Ambulatory Visit: Payer: Self-pay | Admitting: Family Medicine

## 2019-02-17 DIAGNOSIS — I10 Essential (primary) hypertension: Secondary | ICD-10-CM

## 2019-02-17 DIAGNOSIS — F411 Generalized anxiety disorder: Secondary | ICD-10-CM

## 2019-02-17 DIAGNOSIS — E785 Hyperlipidemia, unspecified: Secondary | ICD-10-CM

## 2019-02-17 MED FILL — ESCITALOPRAM 20 MG TABLET: 20 | 30 days supply | Qty: 30 | Fill #0

## 2019-02-17 MED FILL — LOSARTAN POTASSIUM 100 MG T: 100 | 90 days supply | Qty: 90 | Fill #1

## 2019-02-17 MED FILL — HYDROCHLOROTHIAZIDE 25 MG T: 25 | 30 days supply | Qty: 30 | Fill #0

## 2019-02-17 MED FILL — ROSUVASTATIN CALCIUM 20 MG: 20 | 30 days supply | Qty: 30 | Fill #0

## 2019-02-20 ENCOUNTER — Other Ambulatory Visit: Payer: Self-pay | Admitting: Family Medicine

## 2019-02-20 DIAGNOSIS — F411 Generalized anxiety disorder: Secondary | ICD-10-CM

## 2019-02-20 MED ORDER — ALPRAZOLAM 0.5 MG PO TABS: ORAL_TABLET | ORAL | 0 refills | Status: DC

## 2019-02-20 MED FILL — ALPRAZOLAM 0.5 MG TABS: 0.5 | 60 days supply | Qty: 30 | Fill #0

## 2019-03-28 ENCOUNTER — Other Ambulatory Visit: Payer: Self-pay | Admitting: Family Medicine

## 2019-03-28 DIAGNOSIS — F411 Generalized anxiety disorder: Secondary | ICD-10-CM

## 2019-03-28 DIAGNOSIS — I10 Essential (primary) hypertension: Secondary | ICD-10-CM

## 2019-03-28 DIAGNOSIS — E785 Hyperlipidemia, unspecified: Secondary | ICD-10-CM

## 2019-03-28 DIAGNOSIS — J309 Allergic rhinitis, unspecified: Secondary | ICD-10-CM

## 2019-03-29 MED FILL — ESCITALOPRAM 20 MG TABLET: 20 | 30 days supply | Qty: 30 | Fill #0

## 2019-03-29 MED FILL — FLUTICASONE PROP 50 MCG SPR: 50 | 60 days supply | Qty: 16 | Fill #0

## 2019-03-29 MED FILL — ROSUVASTATIN CALCIUM 20 MG: 20 | 90 days supply | Qty: 90 | Fill #0

## 2019-03-29 MED FILL — HYDROCHLOROTHIAZIDE 25 MG T: 25 | 90 days supply | Qty: 90 | Fill #0

## 2019-03-30 DIAGNOSIS — G4733 Obstructive sleep apnea (adult) (pediatric): Secondary | ICD-10-CM | POA: Diagnosis not present

## 2019-04-04 ENCOUNTER — Other Ambulatory Visit: Payer: Self-pay

## 2019-04-04 NOTE — Patient Instructions (Addendum)
It was great to see you today, I will be in touch with your labs ASAP We refilled your medications today Please do work on getting a bit more exercise - if you have any chest pain or shortness of breath with exercise please alert me  Continue to work on cutting down / quitting tobacco  Take care!    Health Maintenance, Male Adopting a healthy lifestyle and getting preventive care are important in promoting health and wellness. Ask your health care provider about:  The right schedule for you to have regular tests and exams.  Things you can do on your own to prevent diseases and keep yourself healthy. What should I know about diet, weight, and exercise? Eat a healthy diet   Eat a diet that includes plenty of vegetables, fruits, low-fat dairy products, and lean protein.  Do not eat a lot of foods that are high in solid fats, added sugars, or sodium. Maintain a healthy weight Body mass index (BMI) is a measurement that can be used to identify possible weight problems. It estimates body fat based on height and weight. Your health care provider can help determine your BMI and help you achieve or maintain a healthy weight. Get regular exercise Get regular exercise. This is one of the most important things you can do for your health. Most adults should:  Exercise for at least 150 minutes each week. The exercise should increase your heart rate and make you sweat (moderate-intensity exercise).  Do strengthening exercises at least twice a week. This is in addition to the moderate-intensity exercise.  Spend less time sitting. Even light physical activity can be beneficial. Watch cholesterol and blood lipids Have your blood tested for lipids and cholesterol at 45 years of age, then have this test every 5 years. You may need to have your cholesterol levels checked more often if:  Your lipid or cholesterol levels are high.  You are older than 45 years of age.  You are at high risk for heart  disease. What should I know about cancer screening? Many types of cancers can be detected early and may often be prevented. Depending on your health history and family history, you may need to have cancer screening at various ages. This may include screening for:  Colorectal cancer.  Prostate cancer.  Skin cancer.  Lung cancer. What should I know about heart disease, diabetes, and high blood pressure? Blood pressure and heart disease  High blood pressure causes heart disease and increases the risk of stroke. This is more likely to develop in people who have high blood pressure readings, are of African descent, or are overweight.  Talk with your health care provider about your target blood pressure readings.  Have your blood pressure checked: ? Every 3-5 years if you are 62-9 years of age. ? Every year if you are 4 years old or older.  If you are between the ages of 71 and 5 and are a current or former smoker, ask your health care provider if you should have a one-time screening for abdominal aortic aneurysm (AAA). Diabetes Have regular diabetes screenings. This checks your fasting blood sugar level. Have the screening done:  Once every three years after age 22 if you are at a normal weight and have a low risk for diabetes.  More often and at a younger age if you are overweight or have a high risk for diabetes. What should I know about preventing infection? Hepatitis B If you have a higher risk  for hepatitis B, you should be screened for this virus. Talk with your health care provider to find out if you are at risk for hepatitis B infection. Hepatitis C Blood testing is recommended for:  Everyone born from [birth year] through 1967.  Anyone with known risk factors for hepatitis C. Sexually transmitted infections (STIs)  You should be screened each year for STIs, including gonorrhea and chlamydia, if: ? You are sexually active and are younger than 45 years of age. ? You are older  than 45 years of age and your health care provider tells you that you are at risk for this type of infection. ? Your sexual activity has changed since you were last screened, and you are at increased risk for chlamydia or gonorrhea. Ask your health care provider if you are at risk.  Ask your health care provider about whether you are at high risk for HIV. Your health care provider may recommend a prescription medicine to help prevent HIV infection. If you choose to take medicine to prevent HIV, you should first get tested for HIV. You should then be tested every 3 months for as long as you are taking the medicine. Follow these instructions at home: Lifestyle  Do not use any products that contain nicotine or tobacco, such as cigarettes, e-cigarettes, and chewing tobacco. If you need help quitting, ask your health care provider.  Do not use street drugs.  Do not share needles.  Ask your health care provider for help if you need support or information about quitting drugs. Alcohol use  Do not drink alcohol if your health care provider tells you not to drink.  If you drink alcohol: ? Limit how much you have to 0-2 drinks a day. ? Be aware of how much alcohol is in your drink. In the [place] one drink equals one 12 oz bottle of beer (355 mL), one 5 oz glass of wine (148 mL), or one 1 oz glass of hard liquor (44 mL). General instructions  Schedule regular health, dental, and eye exams.  Stay current with your vaccines.  Tell your health care provider if: ? You often feel depressed. ? You have ever been abused or do not feel safe at home. Summary  Adopting a healthy lifestyle and getting preventive care are important in promoting health and wellness.  Follow your health care provider's instructions about healthy diet, exercising, and getting tested or screened for diseases.  Follow your health care provider's instructions on monitoring your cholesterol and blood pressure. This  information is not intended to replace advice given to you by your health care provider. Make sure you discuss any questions you have with your health care provider. Document Released: 01/02/2008 Document Revised: 06/29/2018 Document Reviewed: 06/29/2018 Elsevier Patient Education  2020 [employer].

## 2019-04-04 NOTE — Progress Notes (Addendum)
[facility] [address] [place], [place] [address] [phone]  Date:  04/05/2019   Name:  Randall Swanson   DOB:  [birth year]   MRN:  [id number]  PCP:  Pearline Cables, MD    Chief Complaint: Annual Exam   History of Present Illness:  Randall Swanson is a 43 y.o. very pleasant male patient who presents with the following:  Gentleman with history of hypertension, COPD, dyslipidemia, GERD here today for a complete physical Last seen by myself in June 2019 for physical He has a CPAP machine and tries to use it as best he can He does tend to have seasonal allergies- he uses flonase but is out.   It has tried an OTC antihistamine on occasion  He is a Investment banker, operational, has 3 daughters.  Their middle daughter has cerebral palsy, eldest has mild ADD.  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 Lumbar degenerative disc disease 01/25/2017  . Carpal tunnel syndrome, right 01/25/2017  . Dyslipidemia 11/27/2016  . GAD (generalized anxiety disorder) 11/26/2016  . COPD, mild (HCC) 11/26/2016  . Snoring 11/26/2016  . Allergic rhinitis 11/26/2016  . Smoker 11/26/2016  . ESOPHAGEAL REFLUX 04/28/2011  . Essential hypertension 04/02/2011    Past Medical History:  Diagnosis Date  . Acid reflux   . COPD (chronic obstructive pulmonary disease) (HCC)   .  Hypertension     Past Surgical History:  Procedure Laterality Date  . HERNIA REPAIR      Social History   Tobacco Use  . Smoking status: Current Every Day Smoker  . Smokeless tobacco: Never Used  Substance Use Topics  . Alcohol use: No  . Drug use: No    Family History  Problem Relation Age of Onset  . Hypertension Mother   . Hypertension Father     No Known Allergies  Medication list has been reviewed and updated.  Current Outpatient Medications on File Prior to Visit  Medication Sig Dispense Refill  . meloxicam (MOBIC) 15 MG tablet One tab PO qAM with breakfast for 2 weeks, then daily prn pain. 30 tablet 3  . methocarbamol (ROBAXIN) 500 MG tablet Take 1 tablet (500 mg total) by mouth 2 (two) times daily. 20 tablet 0  . VENTOLIN HFA 108 (90 Base) MCG/ACT inhaler INHALE 2 PUFFS INTO THE LUNGS EVERY 6 HOURS AS NEEDED. 18 g 9   No current facility-administered medications on file prior to visit.     Review of Systems:  As per HPI- otherwise negative. He occasionally checks home BP- generally under 140/90 He is not exercising much right now No chest pain  He did do a stress test he thinks for 5 years ago, I cannot see this report.  He  reports he did fine as far she can recall There is family history of heart disease in his 2 grandfathers  Physical Examination: Vitals:   04/05/19 1018  BP: 112/82  Pulse: 63  Resp: 16  Temp: (!) 96.4 F (35.8 C)  SpO2: 95%   Vitals:   04/05/19 1018  Weight: 246 lb (111.6 kg)  Height: 6\' 1"  (1.854 m)   Body mass index is 32.46 kg/m. Ideal Body Weight: Weight in (lb) to have BMI = 25: 189.1  GEN: WDWN, NAD, Non-toxic, A & O x 3, obese, looks well HEENT: Atraumatic, Normocephalic. Neck supple. No masses, No LAD.  TM within normal limits Ears and Nose: No external deformity. CV: RRR, No M/G/R. No JVD. No thrill. No extra heart sounds. PULM: CTA B, no wheezes, crackles, rhonchi. No retractions. No resp. distress. No  accessory muscle use. ABD: S, NT, ND, +BS. No rebound. No HSM. EXTR: No c/c/e NEURO Normal gait.  PSYCH: Normally interactive. Conversant. Not depressed or anxious appearing.  Calm demeanor.    Assessment and Plan: Physical exam  GAD (generalized anxiety disorder) - Plan: ALPRAZolam (XANAX) 0.5 MG tablet, escitalopram (LEXAPRO) 20 MG tablet  Hyperlipidemia, unspecified hyperlipidemia type - Plan: Lipid panel  Screening for diabetes mellitus - Plan: Comprehensive metabolic panel, Hemoglobin A1c  Immunization due  Medication monitoring encounter - Plan: CBC  Essential hypertension - Plan: CBC, Comprehensive metabolic panel, hydrochlorothiazide (HYDRODIURIL) 25 MG tablet, valsartan (DIOVAN) 320 MG tablet  Screening for HIV (human immunodeficiency virus) - Plan: HIV Antibody (routine testing w rflx)  Allergic rhinitis, unspecified seasonality, unspecified trigger - Plan: fluticasone (FLONASE) 50 MCG/ACT nasal spray  Dyslipidemia - Plan: rosuvastatin (CRESTOR) 20 MG tablet  Here today for complete physical Blood pressure under good control, refilled medications Labs pending as above Refill Crestor Refilled Lexapro and as needed Xanax for mood Encouraged exercise and smoking cessation Asked him to report any chest pain or shortness of breath with exercise  Signed Abbe AmsterdamJessica Margret Moat, MD  Received his labs, letter to patient Results for orders placed or performed in visit on 04/05/19  CBC  Result Value Ref Range   WBC 6.6 4.0 - 10.5 K/uL   RBC 5.14 4.22 - 5.81 Mil/uL   Platelets 196.0 150.0 - 400.0 K/uL   Hemoglobin 15.5 13.0 - 17.0 g/dL   HCT 11.945.2 14.739.0 - 82.952.0 %   MCV 87.8 78.0 - 100.0 fl   MCHC 34.3 30.0 - 36.0 g/dL   RDW 56.213.2 13.011.5 - 86.515.5 %  Comprehensive metabolic panel  Result Value Ref Range   Sodium 139 135 - 145 mEq/L   Potassium 3.8 3.5 - 5.1 mEq/L   Chloride 102 96 - 112 mEq/L   CO2 30 19 - 32 mEq/L   Glucose, Bld 88 70 - 99 mg/dL   BUN 14 6 - 23 mg/dL    Creatinine, Ser 7.840.73 0.40 - 1.50 mg/dL   Total Bilirubin 0.5 0.2 - 1.2 mg/dL   Alkaline Phosphatase 53 39 - 117 U/L   AST 26 0 - 37 U/L   ALT 42 0 - 53 U/L   Total Protein 6.7 6.0 - 8.3 g/dL   Albumin 4.5 3.5 - 5.2 g/dL   Calcium 9.6 8.4 - 69.610.5 mg/dL   GFR 295.28116.32 >41.32>60.00 mL/min  Hemoglobin A1c  Result Value Ref Range   Hgb A1c MFr Bld 6.0 4.6 - 6.5 %  Lipid panel  Result Value Ref Range   Cholesterol 131 0 - 200 mg/dL   Triglycerides 440.1243.0 (  H) 0.0 - 149.0 mg/dL   HDL 32.00 (L) >39.00 mg/dL   VLDL 48.6 (H) 0.0 - 40.0 mg/dL   Total CHOL/HDL Ratio 4    NonHDL 98.84   HIV Antibody (routine testing w rflx)  Result Value Ref Range   HIV 1&2 Ab, 4th Generation NON-REACTIVE NON-REACTI  LDL cholesterol, direct  Result Value Ref Range   Direct LDL 82.0 mg/dL

## 2019-04-05 ENCOUNTER — Ambulatory Visit (INDEPENDENT_AMBULATORY_CARE_PROVIDER_SITE_OTHER): Payer: 59 | Admitting: Family Medicine

## 2019-04-05 ENCOUNTER — Encounter: Payer: Self-pay | Admitting: Family Medicine

## 2019-04-05 VITALS — BP 112/82 | HR 63 | Temp 96.4°F | Resp 16 | Ht 73.0 in | Wt 246.0 lb

## 2019-04-05 DIAGNOSIS — J309 Allergic rhinitis, unspecified: Secondary | ICD-10-CM

## 2019-04-05 DIAGNOSIS — E785 Hyperlipidemia, unspecified: Secondary | ICD-10-CM

## 2019-04-05 DIAGNOSIS — Z23 Encounter for immunization: Secondary | ICD-10-CM

## 2019-04-05 DIAGNOSIS — I10 Essential (primary) hypertension: Secondary | ICD-10-CM

## 2019-04-05 DIAGNOSIS — Z114 Encounter for screening for human immunodeficiency virus [HIV]: Secondary | ICD-10-CM

## 2019-04-05 DIAGNOSIS — Z0000 Encounter for general adult medical examination without abnormal findings: Secondary | ICD-10-CM | POA: Diagnosis not present

## 2019-04-05 DIAGNOSIS — Z5181 Encounter for therapeutic drug level monitoring: Secondary | ICD-10-CM | POA: Diagnosis not present

## 2019-04-05 DIAGNOSIS — Z131 Encounter for screening for diabetes mellitus: Secondary | ICD-10-CM

## 2019-04-05 DIAGNOSIS — F411 Generalized anxiety disorder: Secondary | ICD-10-CM

## 2019-04-05 LAB — COMPREHENSIVE METABOLIC PANEL
ALT: 42 U/L (ref 0–53)
AST: 26 U/L (ref 0–37)
Albumin: 4.5 g/dL (ref 3.5–5.2)
Alkaline Phosphatase: 53 U/L (ref 39–117)
BUN: 14 mg/dL (ref 6–23)
CO2: 30 mEq/L (ref 19–32)
Calcium: 9.6 mg/dL (ref 8.4–10.5)
Chloride: 102 mEq/L (ref 96–112)
Creatinine, Ser: 0.73 mg/dL (ref 0.40–1.50)
GFR: 116.32 mL/min (ref >60.00)
Glucose, Bld: 88 mg/dL (ref 70–99)
Potassium: 3.8 mEq/L (ref 3.5–5.1)
Sodium: 139 mEq/L (ref 135–145)
Total Bilirubin: 0.5 mg/dL (ref 0.2–1.2)
Total Protein: 6.7 g/dL (ref 6.0–8.3)

## 2019-04-05 LAB — CBC
HCT: 45.2 % (ref 39.0–52.0)
Hemoglobin: 15.5 g/dL (ref 13.0–17.0)
MCHC: 34.3 g/dL (ref 30.0–36.0)
MCV: 87.8 fl (ref 78.0–100.0)
Platelets: 196 10*3/uL (ref 150.0–400.0)
RBC: 5.14 Mil/uL (ref 4.22–5.81)
RDW: 13.2 % (ref 11.5–15.5)
WBC: 6.6 10*3/uL (ref 4.0–10.5)

## 2019-04-05 LAB — LIPID PANEL
Cholesterol: 131 mg/dL (ref 0–200)
HDL: 32 mg/dL — ABNORMAL LOW (ref >39.00)
NonHDL: 98.84
Total CHOL/HDL Ratio: 4
Triglycerides: 243 mg/dL — ABNORMAL HIGH (ref 0.0–149.0)
VLDL: 48.6 mg/dL — ABNORMAL HIGH (ref 0.0–40.0)

## 2019-04-05 LAB — HEMOGLOBIN A1C: Hgb A1c MFr Bld: 6 % (ref 4.6–6.5)

## 2019-04-05 LAB — LDL CHOLESTEROL, DIRECT: Direct LDL: 82 mg/dL

## 2019-04-05 MED ORDER — VALSARTAN 320 MG PO TABS: 320.0000 mg | ORAL_TABLET | Freq: Every day | ORAL | 3 refills | Status: DC

## 2019-04-05 MED ORDER — ESCITALOPRAM OXALATE 20 MG PO TABS: 20.0000 mg | ORAL_TABLET | Freq: Every day | ORAL | 3 refills | Status: DC

## 2019-04-05 MED ORDER — FLUTICASONE PROPIONATE 50 MCG/ACT NA SUSP: 1.0000 | Freq: Every day | NASAL | 9 refills | Status: DC

## 2019-04-05 MED ORDER — HYDROCHLOROTHIAZIDE 25 MG PO TABS: 25.0000 mg | ORAL_TABLET | Freq: Every day | ORAL | 3 refills | Status: DC

## 2019-04-05 MED ORDER — ROSUVASTATIN CALCIUM 20 MG PO TABS: 20.0000 mg | ORAL_TABLET | Freq: Every day | ORAL | 3 refills | Status: DC

## 2019-04-05 MED ORDER — ALPRAZOLAM 0.5 MG PO TABS: ORAL_TABLET | ORAL | 0 refills | Status: DC

## 2019-04-05 MED FILL — VALSARTAN 320 MG TAB: 320 | 90 days supply | Qty: 90 | Fill #0

## 2019-04-06 LAB — HIV ANTIBODY (ROUTINE TESTING W REFLEX): HIV 1&2 Ab, 4th Generation: NONREACTIVE

## 2019-05-03 MED FILL — ESCITALOPRAM 20 MG TABLET: 20 | 90 days supply | Qty: 90 | Fill #0

## 2019-05-23 MED FILL — FLUTICASONE PROP 50 MCG SPR: 50 | 60 days supply | Qty: 16 | Fill #0

## 2019-06-13 MED FILL — FLUTICASONE PROP 50 MCG SPR: 50 | 60 days supply | Qty: 16 | Fill #0

## 2019-06-22 DIAGNOSIS — G4733 Obstructive sleep apnea (adult) (pediatric): Secondary | ICD-10-CM | POA: Diagnosis not present

## 2019-06-27 MED FILL — ROSUVASTATIN CALCIUM 20 MG: 20 | 90 days supply | Qty: 90 | Fill #0

## 2019-06-27 MED FILL — HYDROCHLOROTHIAZIDE 25 MG T: 25 | 90 days supply | Qty: 90 | Fill #0

## 2019-06-27 MED FILL — VALSARTAN 320 MG TAB: 320 | 90 days supply | Qty: 90 | Fill #1

## 2019-06-28 ENCOUNTER — Other Ambulatory Visit: Payer: Self-pay | Admitting: Sports Medicine

## 2019-06-28 DIAGNOSIS — M5136 Other intervertebral disc degeneration, lumbar region: Secondary | ICD-10-CM

## 2019-08-11 MED FILL — ESCITALOPRAM 20 MG TABLET: 20 | 90 days supply | Qty: 90 | Fill #1

## 2019-09-05 MED FILL — FLUTICASONE PROP 50 MCG SPR: 50 | 60 days supply | Qty: 16 | Fill #1

## 2019-09-14 DIAGNOSIS — G4733 Obstructive sleep apnea (adult) (pediatric): Secondary | ICD-10-CM | POA: Diagnosis not present

## 2019-10-03 MED FILL — HYDROCHLOROTHIAZIDE 25 MG T: 25 | 90 days supply | Qty: 90 | Fill #1

## 2019-10-03 MED FILL — ROSUVASTATIN CALCIUM 20 MG: 20 | 90 days supply | Qty: 90 | Fill #1

## 2019-10-03 MED FILL — VALSARTAN 320 MG TABS: 320 | 90 days supply | Qty: 90 | Fill #2

## 2019-10-05 ENCOUNTER — Ambulatory Visit: Payer: 59

## 2019-10-30 ENCOUNTER — Ambulatory Visit: Payer: 59 | Attending: Internal Medicine

## 2019-10-30 DIAGNOSIS — Z23 Encounter for immunization: Secondary | ICD-10-CM

## 2019-10-30 NOTE — Progress Notes (Signed)
   Covid-19 Vaccination Clinic  Name:  JAREMY NOSAL    MRN: [id number] DOB: Sep 03, 1975  10/30/2019  Mr. Spahr was observed post Covid-19 immunization for 15 minutes without incident. He was provided with Vaccine Information Sheet and instruction to access the V-Safe system.   Mr. Nygaard was instructed to call 911 with any severe reactions post vaccine: Marland Kitchen Difficulty breathing  . Swelling of face and throat  . A fast heartbeat  . A bad rash all over body  . Dizziness and weakness   Immunizations Administered    Name Date Dose VIS Date Route   Pfizer COVID-19 Vaccine 10/30/2019 11:39 AM 0.3 mL 06/30/2019 Intramuscular   Manufacturer: [employer], [employer]   Lot: [id number]   NDC: [address]-8

## 2019-11-10 MED FILL — FLUTICASONE PROP 50 MCG SPR: 50 | 60 days supply | Qty: 16 | Fill #2

## 2019-11-10 MED FILL — ESCITALOPRAM 20 MG TABLET: 20 | 90 days supply | Qty: 90 | Fill #2

## 2019-11-20 ENCOUNTER — Ambulatory Visit: Payer: 59 | Attending: Internal Medicine

## 2019-11-20 DIAGNOSIS — Z23 Encounter for immunization: Secondary | ICD-10-CM

## 2019-11-20 NOTE — Progress Notes (Signed)
   Covid-19 Vaccination Clinic  Name:  Randall Swanson    MRN: [id number] DOB: January 23, 1976  11/20/2019  Randall Swanson was observed post Covid-19 immunization for 15 minutes without incident. He was provided with Vaccine Information Sheet and instruction to access the V-Safe system.   Randall Swanson was instructed to call 911 with any severe reactions post vaccine: Marland Kitchen Difficulty breathing  . Swelling of face and throat  . A fast heartbeat  . A bad rash all over body  . Dizziness and weakness   Immunizations Administered    Name Date Dose VIS Date Route   Pfizer COVID-19 Vaccine 11/20/2019  1:30 PM 0.3 mL 09/13/2018 Intramuscular   Manufacturer: [employer], [employer]   Lot: [id number]   NDC: [address]-8

## 2019-12-13 DIAGNOSIS — G4733 Obstructive sleep apnea (adult) (pediatric): Secondary | ICD-10-CM | POA: Diagnosis not present

## 2019-12-26 ENCOUNTER — Telehealth: Payer: Self-pay

## 2019-12-26 NOTE — Telephone Encounter (Signed)
Please schedule patient virtual visit if appt available.

## 2019-12-26 NOTE — Telephone Encounter (Signed)
After Hours Call:  Caller is having cough symptoms for the past 7 days and cant seem to shake it, Caller states he does not believe it is covid as his family has already been through that -- Caller wants to know if there is an available appt

## 2020-01-10 MED FILL — ROSUVASTATIN CALCIUM 20 MG: 20 | 90 days supply | Qty: 90 | Fill #2

## 2020-01-10 MED FILL — VALSARTAN 320 MG TABLET: 320 | 90 days supply | Qty: 90 | Fill #3

## 2020-01-10 MED FILL — FLUTICASONE PROP 50 MCG SPR: 50 | 60 days supply | Qty: 16 | Fill #3

## 2020-01-10 MED FILL — HYDROCHLOROTHIAZIDE 25 MG T: 25 | 90 days supply | Qty: 90 | Fill #2

## 2020-02-08 MED FILL — ESCITALOPRAM 20 MG TABLET: 20 | 90 days supply | Qty: 90 | Fill #3

## 2020-03-06 DIAGNOSIS — G4733 Obstructive sleep apnea (adult) (pediatric): Secondary | ICD-10-CM | POA: Diagnosis not present

## 2020-03-14 MED FILL — FLUTICASONE PROP 50 MCG SPR: 50 | 60 days supply | Qty: 16 | Fill #4

## 2020-04-30 ENCOUNTER — Other Ambulatory Visit: Payer: Self-pay | Admitting: Family Medicine

## 2020-04-30 ENCOUNTER — Other Ambulatory Visit: Payer: Self-pay

## 2020-04-30 DIAGNOSIS — F411 Generalized anxiety disorder: Secondary | ICD-10-CM

## 2020-04-30 DIAGNOSIS — I10 Essential (primary) hypertension: Secondary | ICD-10-CM

## 2020-04-30 DIAGNOSIS — E785 Hyperlipidemia, unspecified: Secondary | ICD-10-CM

## 2020-04-30 DIAGNOSIS — Z006 Encounter for examination for normal comparison and control in clinical research program: Secondary | ICD-10-CM

## 2020-04-30 DIAGNOSIS — J309 Allergic rhinitis, unspecified: Secondary | ICD-10-CM

## 2020-05-01 ENCOUNTER — Other Ambulatory Visit: Payer: Self-pay | Admitting: Family Medicine

## 2020-05-01 DIAGNOSIS — F411 Generalized anxiety disorder: Secondary | ICD-10-CM

## 2020-05-01 DIAGNOSIS — E785 Hyperlipidemia, unspecified: Secondary | ICD-10-CM

## 2020-05-01 DIAGNOSIS — J309 Allergic rhinitis, unspecified: Secondary | ICD-10-CM

## 2020-05-01 DIAGNOSIS — I10 Essential (primary) hypertension: Secondary | ICD-10-CM

## 2020-05-02 ENCOUNTER — Other Ambulatory Visit: Payer: Self-pay | Admitting: Family Medicine

## 2020-05-02 MED FILL — ROSUVASTATIN CALCIUM 20 MG: 20 | 30 days supply | Qty: 30 | Fill #0

## 2020-05-02 MED FILL — FLUTICASONE PROP 50 MCG SPR: 50 | 60 days supply | Qty: 16 | Fill #0

## 2020-05-02 MED FILL — ESCITALOPRAM 20 MG TABLET: 20 | 30 days supply | Qty: 30 | Fill #0

## 2020-05-02 MED FILL — HYDROCHLOROTHIAZIDE 25 MG T: 25 | 30 days supply | Qty: 30 | Fill #0

## 2020-05-02 MED FILL — VALSARTAN 320 MG TABLET: 320 | 30 days supply | Qty: 30 | Fill #0

## 2020-05-06 ENCOUNTER — Other Ambulatory Visit: Payer: Self-pay | Admitting: Emergency Medicine

## 2020-05-06 ENCOUNTER — Encounter: Payer: Self-pay | Admitting: Emergency Medicine

## 2020-05-06 ENCOUNTER — Emergency Department (INDEPENDENT_AMBULATORY_CARE_PROVIDER_SITE_OTHER)
Admission: EM | Admit: 2020-05-06 | Discharge: 2020-05-06 | Disposition: A | Discharge status: Home / Self Care | Payer: 59 | Source: Home / Self Care

## 2020-05-06 ENCOUNTER — Other Ambulatory Visit: Payer: Self-pay

## 2020-05-06 DIAGNOSIS — L298 Other pruritus: Secondary | ICD-10-CM

## 2020-05-06 MED ORDER — PREDNISONE 20 MG PO TABS: 40.0000 mg | ORAL_TABLET | Freq: Every day | ORAL | 0 refills | Status: DC

## 2020-05-06 MED ORDER — DEXAMETHASONE SODIUM PHOSPHATE 10 MG/ML IJ SOLN
10.0000 mg | Freq: Once | INTRAMUSCULAR | Status: AC
  Administered 2020-05-06: 10 mg via INTRAMUSCULAR

## 2020-05-06 MED ORDER — TRIAMCINOLONE ACETONIDE 0.1 % EX CREA: 1.0000 "application " | TOPICAL_CREAM | Freq: Two times a day (BID) | CUTANEOUS | 0 refills | Status: DC

## 2020-05-06 NOTE — Discharge Instructions (Signed)
°  You were given a shot of decadron (a steroid) today to help with itching and swelling from a likely allergic reaction.  You have been prescribed 4 days of prednisone, an oral steroid.  You may start this medication tomorrow with breakfast.    Follow up with family medicine later this week if not improving.

## 2020-05-06 NOTE — ED Triage Notes (Signed)
Rash Left elbow, Left knee, red, itchy x 10 days vaccinated

## 2020-05-06 NOTE — ED Provider Notes (Signed)
[name]    CSN: [id number] Arrival date & time: 05/06/20  1057      History   Chief Complaint Chief Complaint  Patient presents with  . Rash    HPI Randall Swanson is a 44 y.o. male.   HPI Randall Swanson is a 44 y.o. male presenting to UC with c/o itchy red rash that started on Left elbow about 10 days ago and has since spread to his Left knee and smaller areas on both ankles.  He has tried OTC cortisone cream without relief. No new soaps, lotions or medications. Known allergy to poison ivy, he has had similar rash but has not been outside to come in contact with poison ivy recently. He has done well on steroids in the past.    Past Medical History:  Diagnosis Date  . Acid reflux   . COPD (chronic obstructive pulmonary disease) (HCC)   . Hypertension     Patient Active Problem List   Diagnosis Date Noted  . Lumbar degenerative disc disease 01/25/2017  . Carpal tunnel syndrome, right 01/25/2017  . Dyslipidemia 11/27/2016  . GAD (generalized anxiety disorder) 11/26/2016  . COPD, mild (HCC) 11/26/2016  . Snoring 11/26/2016  . Allergic rhinitis 11/26/2016  . Smoker 11/26/2016  . ESOPHAGEAL REFLUX 04/28/2011  . Essential hypertension 04/02/2011    Past Surgical History:  Procedure Laterality Date  . HERNIA REPAIR         Home Medications    Prior to Admission medications   Medication Sig Start Date End Date Taking? Authorizing Provider  rosuvastatin (CRESTOR) 20 MG tablet Take 20 mg by mouth daily.   Yes [provider]  ALPRAZolam (XANAX) 0.5 MG tablet TAKE 1/2 TABLET BY MOUTH EVERYDAY AS NEEDED FOR ANXIETY 04/05/19   Copland, Gwenlyn Found, MD  escitalopram (LEXAPRO) 20 MG tablet TAKE 1 TABLET (20 MG TOTAL) BY MOUTH DAILY. 05/02/20   Copland, Gwenlyn Found, MD  fluticasone (FLONASE) 50 MCG/ACT nasal spray PLACE 1 SPRAY INTO BOTH NOSTRILS DAILY. 05/02/20   Copland, Gwenlyn Found, MD  hydrochlorothiazide (HYDRODIURIL) 25 MG tablet TAKE 1 TABLET (25  MG TOTAL) BY MOUTH DAILY. 05/02/20   Copland, Gwenlyn Found, MD  meloxicam (MOBIC) 15 MG tablet One tab PO qAM with breakfast for 2 weeks, then daily prn pain. 01/25/17   Monica Becton, MD  predniSONE (DELTASONE) 20 MG tablet Take 2 tablets (40 mg total) by mouth daily with breakfast. 05/06/20   Lurene Shadow, PA-C  triamcinolone cream (KENALOG) 0.1 % Apply 1 application topically 2 (two) times daily. 05/06/20   Lurene Shadow, PA-C  valsartan (DIOVAN) 320 MG tablet TAKE 1 TABLET (320 MG TOTAL) BY MOUTH DAILY. 05/02/20   Copland, Gwenlyn Found, MD  VENTOLIN HFA 108 (90 Base) MCG/ACT inhaler INHALE 2 PUFFS INTO THE LUNGS EVERY 6 HOURS AS NEEDED. 01/06/19   Copland, Gwenlyn Found, MD    Family History Family History  Problem Relation Age of Onset  . Hypertension Mother   . Hypertension Father     Social History Social History   Tobacco Use  . Smoking status: Current Every Day Smoker  . Smokeless tobacco: Never Used  Vaping Use  . Vaping Use: Never used  Substance Use Topics  . Alcohol use: Yes  . Drug use: No     Allergies   Patient has no known allergies.   Review of Systems Review of Systems  Constitutional: Negative for chills and fever.  HENT: Negative for facial swelling.  Respiratory: Negative for shortness of breath and wheezing.   Musculoskeletal: Negative for arthralgias, joint swelling and myalgias.  Skin: Positive for rash.     Physical Exam Triage Vital Signs ED Triage Vitals  Enc Vitals Group     BP 05/06/20 1157 128/79     Pulse Rate 05/06/20 1157 67     Resp --      Temp 05/06/20 1157 97.7 F (36.5 C)     Temp Source 05/06/20 1157 Oral     SpO2 05/06/20 1157 96 %     Weight 05/06/20 1158 245 lb (111.1 kg)     Height 05/06/20 1158 6\' 1"  (1.854 m)     Head Circumference --      Peak Flow --      Pain Score 05/06/20 1158 0     Pain Loc --      Pain Edu? --      Excl. in GC? --    No data found.  Updated Vital Signs BP 128/79 (BP Location: Right  Arm)   Pulse 67   Temp 97.7 F (36.5 C) (Oral)   Ht 6\' 1"  (1.854 m)   Wt 245 lb (111.1 kg)   SpO2 96%   BMI 32.32 kg/m   Visual Acuity Right Eye Distance:   Left Eye Distance:   Bilateral Distance:    Right Eye Near:   Left Eye Near:    Bilateral Near:     Physical Exam Vitals and nursing note reviewed.  Constitutional:      Appearance: Normal appearance. He is well-developed.  HENT:     Head: Normocephalic and atraumatic.  Cardiovascular:     Rate and Rhythm: Normal rate.  Pulmonary:     Effort: Pulmonary effort is normal. No respiratory distress.  Musculoskeletal:        General: Normal range of motion.     Cervical back: Normal range of motion.  Skin:    General: Skin is warm and dry.     Findings: Erythema and rash present.     Comments: Left elbow and knee: erythematous maculopapular rash. Non-tender. No edema.  Bilateral ankles 2 cm areas of erythematous papular rash. No drainage.   Neurological:     Mental Status: He is alert and oriented to person, place, and time.  Psychiatric:        Behavior: Behavior normal.      UC Treatments / Results  Labs (all labs ordered are listed, but only abnormal results are displayed) Labs Reviewed - No data to display  EKG   Radiology No results found.  Procedures Procedures (including critical care time)  Medications Ordered in UC Medications  dexamethasone (DECADRON) injection 10 mg (10 mg Intramuscular Given 05/06/20 1319)    Initial Impression / Assessment and Plan / UC Course  I have reviewed the triage vital signs and the nursing notes.  Pertinent labs & imaging results that were available during my care of the patient were reviewed by me and considered in my medical decision making (see chart for details).    Rash appears c/w contact dermatitis No evidence of secondary bacterial infection at this time. Will tx with steroids F/u with PCP as needed  Final Clinical Impressions(s) / UC Diagnoses    Final diagnoses:  Pruritic erythematous rash     Discharge Instructions      You were given a shot of decadron (a steroid) today to help with itching and swelling from a likely allergic reaction.  You  have been prescribed 4 days of prednisone, an oral steroid.  You may start this medication tomorrow with breakfast.    Follow up with family medicine later this week if not improving.      ED Prescriptions    Medication Sig Dispense Auth. Provider   predniSONE (DELTASONE) 20 MG tablet Take 2 tablets (40 mg total) by mouth daily with breakfast. 8 tablet Doroteo Glassman, Marlane Hirschmann O, PA-C   triamcinolone cream (KENALOG) 0.1 % Apply 1 application topically 2 (two) times daily. 30 g Lurene Shadow, [place]     PDMP not reviewed this encounter.   Lurene Shadow, [place] 05/06/20 2324

## 2020-05-07 MED FILL — predniSONE 20 MG TABS: 20 | 4 days supply | Qty: 8 | Fill #0

## 2020-05-07 MED FILL — TRIAMCINOLONE ACETONIDE 0.1: 0.1 | 15 days supply | Qty: 30 | Fill #0

## 2020-05-07 NOTE — Progress Notes (Addendum)
[facility] [address] [place], [place] [address] [phone]  Date:  05/08/2020   Name:  Randall Swanson   DOB:  24-Aug-1975   MRN:  [id number]  PCP:  Pearline Cables, MD    Chief Complaint: Medication Refill (med check, refills, changing insurance)   History of Present Illness:  Randall Swanson is a 44 y.o. very pleasant male patient who presents with the following:  Patient here today to follow-up on medications and also discuss a rash history of hypertension, COPD, dyslipidemia, GERD, OSA, obesity Last seen by myself about 1 year ago  He is a Investment banker, operational, has 3 daughters-his middle daughter does have special needs with cerebral palsy Tobacco  He was seen at urgent care on October 18 with concern of an itchy rash-he was given IM Decadron and oral steroid We are not sure what caused this, but he is getting better  No lip or tongue swelling  Flu vaccine- done today  Hepatitis C screening COVID-19 series complete- we talked about a booster Can update labs today- will do for him today when due  He actually did do a colonoscopy at age 24 due to IBS symptoms- it was negative.  He plans to screen again at age 48  unfortunately he may have to be seen elsewhere as his wife took a job with [employer] Patient Active Problem List   Diagnosis Date Noted  . Pre-diabetes 05/08/2020  . Lumbar degenerative disc disease 01/25/2017  . Carpal tunnel syndrome, right 01/25/2017  . Dyslipidemia 11/27/2016  . GAD (generalized anxiety disorder) 11/26/2016  . COPD, mild (HCC) 11/26/2016  . Snoring 11/26/2016  . Allergic rhinitis 11/26/2016  . Smoker 11/26/2016  . ESOPHAGEAL REFLUX 04/28/2011  . Essential hypertension 04/02/2011    Past Medical History:  Diagnosis Date  . Acid reflux   . COPD (chronic obstructive pulmonary disease) (HCC)   . Hypertension     Past Surgical History:  Procedure Laterality Date  . HERNIA REPAIR       Social History   Tobacco Use  . Smoking status: Current Every Day Smoker  . Smokeless tobacco: Never Used  Vaping Use  . Vaping Use: Never used  Substance Use Topics  . Alcohol use: Yes  . Drug use: No    Family History  Problem Relation Age of Onset  . Hypertension Mother   . Hypertension Father     No Known Allergies  Medication list has been reviewed and updated.  Current Outpatient Medications on File Prior to Visit  Medication Sig Dispense Refill  . fluticasone (FLONASE) 50 MCG/ACT nasal spray PLACE 1 SPRAY INTO BOTH NOSTRILS DAILY. 8 g 0  . meloxicam (MOBIC) 15 MG tablet One tab PO qAM with breakfast for 2 weeks, then daily prn pain. 30 tablet 3  . predniSONE (DELTASONE) 20 MG tablet Take 2 tablets (40 mg total) by mouth daily with breakfast. 8 tablet 0  . rosuvastatin (CRESTOR) 20 MG tablet Take 20 mg by mouth daily.    Marland Kitchen triamcinolone cream (KENALOG) 0.1 % Apply 1 application topically 2 (two) times daily. 30 g 0   No current facility-administered medications on file prior to visit.    Review of Systems:  As per HPI- otherwise negative.   Physical Examination: Vitals:   05/08/20 1317  BP: 124/78  Pulse: (!) 57  Resp: 17  SpO2: 97%   Vitals:   05/08/20 1317  Weight: 250  lb (113.4 kg)  Height: 6\' 1"  (1.854 m)   Body mass index is 32.98 kg/m. Ideal Body Weight: Weight in (lb) to have BMI = 25: 189.1  GEN: no acute distress.  Obese, otherwise looks well HEENT: Atraumatic, Normocephalic.  Ears and Nose: No external deformity. CV: RRR, No M/G/R. No JVD. No thrill. No extra heart sounds. PULM: CTA B, no wheezes, crackles, rhonchi. No retractions. No resp. distress. No accessory muscle use. ABD: S, NT, ND, +BS. No rebound. No HSM. EXTR: No c/c/e PSYCH: Normally interactive. Conversant.  Patient has what appears to be a resolving contact dermatitis on his left knee, left elbow   Assessment and Plan: Needs flu shot - Plan: Flu Vaccine QUAD 6+ mos  PF IM (Fluarix Quad PF)  GAD (generalized anxiety disorder) - Plan: ALPRAZolam (XANAX) 0.5 MG tablet, escitalopram (LEXAPRO) 20 MG tablet  Essential hypertension - Plan: hydrochlorothiazide (HYDRODIURIL) 25 MG tablet, valsartan (DIOVAN) 320 MG tablet, CBC, Comprehensive metabolic panel  COPD, mild (HCC) - Plan: albuterol (VENTOLIN HFA) 108 (90 Base) MCG/ACT inhaler  Screening, deficiency anemia, iron - Plan: CBC  Screening for hyperlipidemia - Plan: Lipid panel  Prediabetes - Plan: Hemoglobin A1c  Encounter for hepatitis C screening test for low risk patient - Plan: Hepatitis C antibody  Pre-diabetes  Patient today for follow-up visit Rash has been treated with steroids per urgent care, doing well He rarely uses alprazolam for anxiety, needs refill today Blood pressure under good control Refill chronic medications, routine labs pending as above Will plan further follow- up pending labs.  This visit occurred during the SARS-CoV-2 public health emergency.  Safety protocols were in place, including screening questions prior to the visit, additional usage of staff PPE, and extensive cleaning of exam room while observing appropriate contact time as indicated for disinfecting solutions.    Signed , MD  Addendum 10/21, received labs as below-patient does not have my chart.  Await hepatitis C level for letter Letter sent  Results for orders placed or performed in visit on 05/08/20  CBC  Result Value Ref Range   WBC 12.3 (H) 3.8 - 10.8 Thousand/uL   RBC 4.89 4.20 - 5.80 Million/uL   Hemoglobin 14.9 13.2 - 17.1 g/dL   HCT 05/10/20 38 - 50 %   MCV 88.8 80.0 - 100.0 fL   MCH 30.5 27.0 - 33.0 pg   MCHC 34.3 32.0 - 36.0 g/dL   RDW 84.6 96.2 - 95.2 %   Platelets 219 140 - 400 Thousand/uL   MPV 10.3 7.5 - 12.5 fL  Comprehensive metabolic panel  Result Value Ref Range   Glucose, Bld 110 (H) 65 - 99 mg/dL   BUN 16 7 - 25 mg/dL   Creat 84.1 3.24 - 4.01 mg/dL    BUN/Creatinine Ratio NOT APPLICABLE 6 - 22 (calc)   Sodium 137 135 - 146 mmol/L   Potassium 4.1 3.5 - 5.3 mmol/L   Chloride 101 98 - 110 mmol/L   CO2 25 20 - 32 mmol/L   Calcium 9.6 8.6 - 10.3 mg/dL   Total Protein 7.0 6.1 - 8.1 g/dL   Albumin 4.7 3.6 - 5.1 g/dL   Globulin 2.3 1.9 - 3.7 g/dL (calc)   AG Ratio 2.0 1.0 - 2.5 (calc)   Total Bilirubin 0.4 0.2 - 1.2 mg/dL   Alkaline phosphatase (APISO) 51 36 - 130 U/L   AST 19 10 - 40 U/L   ALT 32 9 - 46 U/L  Hemoglobin A1c  Result Value  Ref Range   Hgb A1c MFr Bld 5.7 (H) <5.7 % of total Hgb   Mean Plasma Glucose 117 (calc)   eAG (mmol/L) 6.5 (calc)  Lipid panel  Result Value Ref Range   Cholesterol 125 <200 mg/dL   HDL 42 > OR = 40 mg/dL   Triglycerides 790 <240 mg/dL   LDL Cholesterol (Calc) 62 mg/dL (calc)   Total CHOL/HDL Ratio 3.0 <5.0 (calc)   Non-HDL Cholesterol (Calc) 83 <973 mg/dL (calc)  Hepatitis C antibody  Result Value Ref Range   Hepatitis C Ab NON-REACTIVE NON-REACTI   SIGNAL TO CUT-OFF 0.02 <1.00

## 2020-05-07 NOTE — Patient Instructions (Addendum)
It was great to see you again today!  We will miss seeing you as a patient- be well! I will be in touch with your labs asap Refills done today   Health Maintenance, Male Adopting a healthy lifestyle and getting preventive care are important in promoting health and wellness. Ask your health care provider about:  The right schedule for you to have regular tests and exams.  Things you can do on your own to prevent diseases and keep yourself healthy. What should I know about diet, weight, and exercise? Eat a healthy diet   Eat a diet that includes plenty of vegetables, fruits, low-fat dairy products, and lean protein.  Do not eat a lot of foods that are high in solid fats, added sugars, or sodium. Maintain a healthy weight Body mass index (BMI) is a measurement that can be used to identify possible weight problems. It estimates body fat based on height and weight. Your health care provider can help determine your BMI and help you achieve or maintain a healthy weight. Get regular exercise Get regular exercise. This is one of the most important things you can do for your health. Most adults should:  Exercise for at least 150 minutes each week. The exercise should increase your heart rate and make you sweat (moderate-intensity exercise).  Do strengthening exercises at least twice a week. This is in addition to the moderate-intensity exercise.  Spend less time sitting. Even light physical activity can be beneficial. Watch cholesterol and blood lipids Have your blood tested for lipids and cholesterol at 46 years of age, then have this test every 5 years. You may need to have your cholesterol levels checked more often if:  Your lipid or cholesterol levels are high.  You are older than 46 years of age.  You are at high risk for heart disease. What should I know about cancer screening? Many types of cancers can be detected early and may often be prevented. Depending on your health history  and family history, you may need to have cancer screening at various ages. This may include screening for:  Colorectal cancer.  Prostate cancer.  Skin cancer.  Lung cancer. What should I know about heart disease, diabetes, and high blood pressure? Blood pressure and heart disease  High blood pressure causes heart disease and increases the risk of stroke. This is more likely to develop in people who have high blood pressure readings, are of African descent, or are overweight.  Talk with your health care provider about your target blood pressure readings.  Have your blood pressure checked: ? Every 3-5 years if you are 79-62 years of age. ? Every year if you are 73 years old or older.  If you are between the ages of 28 and 37 and are a current or former smoker, ask your health care provider if you should have a one-time screening for abdominal aortic aneurysm (AAA). Diabetes Have regular diabetes screenings. This checks your fasting blood sugar level. Have the screening done:  Once every three years after age 58 if you are at a normal weight and have a low risk for diabetes.  More often and at a younger age if you are overweight or have a high risk for diabetes. What should I know about preventing infection? Hepatitis B If you have a higher risk for hepatitis B, you should be screened for this virus. Talk with your health care provider to find out if you are at risk for hepatitis B infection. Hepatitis  C Blood testing is recommended for:  Everyone born from [birth year] through 1967.  Anyone with known risk factors for hepatitis C. Sexually transmitted infections (STIs)  You should be screened each year for STIs, including gonorrhea and chlamydia, if: ? You are sexually active and are younger than 46 years of age. ? You are older than 46 years of age and your health care provider tells you that you are at risk for this type of infection. ? Your sexual activity has changed since you were  last screened, and you are at increased risk for chlamydia or gonorrhea. Ask your health care provider if you are at risk.  Ask your health care provider about whether you are at high risk for HIV. Your health care provider may recommend a prescription medicine to help prevent HIV infection. If you choose to take medicine to prevent HIV, you should first get tested for HIV. You should then be tested every 3 months for as long as you are taking the medicine. Follow these instructions at home: Lifestyle  Do not use any products that contain nicotine or tobacco, such as cigarettes, e-cigarettes, and chewing tobacco. If you need help quitting, ask your health care provider.  Do not use street drugs.  Do not share needles.  Ask your health care provider for help if you need support or information about quitting drugs. Alcohol use  Do not drink alcohol if your health care provider tells you not to drink.  If you drink alcohol: ? Limit how much you have to 0-2 drinks a day. ? Be aware of how much alcohol is in your drink. In the [place] one drink equals one 12 oz bottle of beer (355 mL), one 5 oz glass of wine (148 mL), or one 1 oz glass of hard liquor (44 mL). General instructions  Schedule regular health, dental, and eye exams.  Stay current with your vaccines.  Tell your health care provider if: ? You often feel depressed. ? You have ever been abused or do not feel safe at home. Summary  Adopting a healthy lifestyle and getting preventive care are important in promoting health and wellness.  Follow your health care provider's instructions about healthy diet, exercising, and getting tested or screened for diseases.  Follow your health care provider's instructions on monitoring your cholesterol and blood pressure. This information is not intended to replace advice given to you by your health care provider. Make sure you discuss any questions you have with your health care  provider. Document Revised: 06/29/2018 Document Reviewed: 06/29/2018 Elsevier Patient Education  2020 [employer].

## 2020-05-08 ENCOUNTER — Other Ambulatory Visit: Payer: Self-pay | Admitting: Family Medicine

## 2020-05-08 ENCOUNTER — Other Ambulatory Visit: Payer: Self-pay

## 2020-05-08 ENCOUNTER — Encounter: Payer: Self-pay | Admitting: Family Medicine

## 2020-05-08 ENCOUNTER — Ambulatory Visit: Payer: 59 | Admitting: Family Medicine

## 2020-05-08 VITALS — BP 124/78 | HR 57 | Resp 17 | Ht 73.0 in | Wt 250.0 lb

## 2020-05-08 DIAGNOSIS — I10 Essential (primary) hypertension: Secondary | ICD-10-CM | POA: Diagnosis not present

## 2020-05-08 DIAGNOSIS — Z1159 Encounter for screening for other viral diseases: Secondary | ICD-10-CM

## 2020-05-08 DIAGNOSIS — Z13220 Encounter for screening for lipoid disorders: Secondary | ICD-10-CM | POA: Diagnosis not present

## 2020-05-08 DIAGNOSIS — Z23 Encounter for immunization: Secondary | ICD-10-CM | POA: Diagnosis not present

## 2020-05-08 DIAGNOSIS — R7303 Prediabetes: Secondary | ICD-10-CM | POA: Diagnosis not present

## 2020-05-08 DIAGNOSIS — Z130 Encounter for screening for diseases of the blood and blood-forming organs and certain disorders involving the immune mechanism: Secondary | ICD-10-CM

## 2020-05-08 DIAGNOSIS — J449 Chronic obstructive pulmonary disease, unspecified: Secondary | ICD-10-CM

## 2020-05-08 DIAGNOSIS — F411 Generalized anxiety disorder: Secondary | ICD-10-CM

## 2020-05-08 MED ORDER — HYDROCHLOROTHIAZIDE 25 MG PO TABS: 25.0000 mg | ORAL_TABLET | Freq: Every day | ORAL | 3 refills | Status: DC

## 2020-05-08 MED ORDER — ALPRAZOLAM 0.5 MG PO TABS: ORAL_TABLET | ORAL | 0 refills | Status: DC

## 2020-05-08 MED ORDER — VALSARTAN 320 MG PO TABS: 320.0000 mg | ORAL_TABLET | Freq: Every day | ORAL | 3 refills | Status: DC

## 2020-05-08 MED ORDER — ESCITALOPRAM OXALATE 20 MG PO TABS: 20.0000 mg | ORAL_TABLET | Freq: Every day | ORAL | 3 refills | Status: DC

## 2020-05-08 MED ORDER — ALBUTEROL SULFATE HFA 108 (90 BASE) MCG/ACT IN AERS: INHALATION_SPRAY | RESPIRATORY_TRACT | 9 refills | Status: DC

## 2020-05-08 MED FILL — ALBUTEROL SULFATE HFA 108 (: 108 (90 BAS | 25 days supply | Qty: 18 | Fill #0

## 2020-05-08 MED FILL — ALPRAZolam 0.5 MG TABS: 0.5 | 60 days supply | Qty: 30 | Fill #0

## 2020-05-09 LAB — HEMOGLOBIN A1C
Hgb A1c MFr Bld: 5.7 % of total Hgb — ABNORMAL HIGH (ref <5.7)
Mean Plasma Glucose: 117 (calc)
eAG (mmol/L): 6.5 (calc)

## 2020-05-09 LAB — COMPREHENSIVE METABOLIC PANEL
AG Ratio: 2 (calc) (ref 1.0–2.5)
ALT: 32 U/L (ref 9–46)
AST: 19 U/L (ref 10–40)
Albumin: 4.7 g/dL (ref 3.6–5.1)
Alkaline phosphatase (APISO): 51 U/L (ref 36–130)
BUN: 16 mg/dL (ref 7–25)
CO2: 25 mmol/L (ref 20–32)
Calcium: 9.6 mg/dL (ref 8.6–10.3)
Chloride: 101 mmol/L (ref 98–110)
Creat: 0.76 mg/dL (ref 0.60–1.35)
Globulin: 2.3 g/dL (calc) (ref 1.9–3.7)
Glucose, Bld: 110 mg/dL — ABNORMAL HIGH (ref 65–99)
Potassium: 4.1 mmol/L (ref 3.5–5.3)
Sodium: 137 mmol/L (ref 135–146)
Total Bilirubin: 0.4 mg/dL (ref 0.2–1.2)
Total Protein: 7 g/dL (ref 6.1–8.1)

## 2020-05-09 LAB — LIPID PANEL
Cholesterol: 125 mg/dL (ref <200)
HDL: 42 mg/dL (ref >=40)
LDL Cholesterol (Calc): 62 mg/dL (calc)
Non-HDL Cholesterol (Calc): 83 mg/dL (calc) (ref <130)
Total CHOL/HDL Ratio: 3 (calc) (ref <5.0)
Triglycerides: 124 mg/dL (ref <150)

## 2020-05-09 LAB — CBC
HCT: 43.4 % (ref 38.5–50.0)
Hemoglobin: 14.9 g/dL (ref 13.2–17.1)
MCH: 30.5 pg (ref 27.0–33.0)
MCHC: 34.3 g/dL (ref 32.0–36.0)
MCV: 88.8 fL (ref 80.0–100.0)
MPV: 10.3 fL (ref 7.5–12.5)
Platelets: 219 10*3/uL (ref 140–400)
RBC: 4.89 10*6/uL (ref 4.20–5.80)
RDW: 13.1 % (ref 11.0–15.0)
WBC: 12.3 10*3/uL — ABNORMAL HIGH (ref 3.8–10.8)

## 2020-05-09 LAB — HEPATITIS C ANTIBODY
Hepatitis C Ab: NONREACTIVE
SIGNAL TO CUT-OFF: 0.02 (ref <1.00)

## 2020-05-29 ENCOUNTER — Encounter: Payer: Self-pay | Admitting: Emergency Medicine

## 2020-05-29 ENCOUNTER — Other Ambulatory Visit: Payer: Self-pay

## 2020-05-29 ENCOUNTER — Emergency Department
Admission: EM | Admit: 2020-05-29 | Discharge: 2020-05-29 | Disposition: A | Discharge status: Home / Self Care | Payer: 59 | Source: Home / Self Care | Attending: Family Medicine | Admitting: Family Medicine

## 2020-05-29 DIAGNOSIS — R21 Rash and other nonspecific skin eruption: Secondary | ICD-10-CM

## 2020-05-29 LAB — POCT CBC W AUTO DIFF (K'VILLE URGENT CARE)

## 2020-05-29 MED ORDER — HYDROXYZINE PAMOATE 50 MG PO CAPS: ORAL_CAPSULE | ORAL | 1 refills | Status: AC

## 2020-05-29 MED ORDER — METHYLPREDNISOLONE ACETATE 80 MG/ML IJ SUSP
80.0000 mg | Freq: Once | INTRAMUSCULAR | Status: AC
Start: 2020-05-29 — End: 2020-05-29
  Administered 2020-05-29: 80 mg via INTRAMUSCULAR

## 2020-05-29 NOTE — ED Provider Notes (Signed)
Randall Swanson [facility]    CSN: [id number] Arrival date & time: 05/29/20  1312      History   Chief Complaint Chief Complaint  Patient presents with  . Rash    HPI Randall Swanson is a 44 y.o. male.   Patient was evaluated in urgent care on 05/06/20 for a pruritic maculopapular erythematous rash of unknown cause on his left elbow, left knee, and small areas on both ankles.  The rash resolved after Decadron 10mg  IM and prednisone for four days. A milder pruritic rash has now occurred on different areas of his legs.  He has had no relief from Zyrtec and topical preparations.  He feels well otherwise.  The history is provided by the patient.    Past Medical History:  Diagnosis Date  . Acid reflux   . COPD (chronic obstructive pulmonary disease) (HCC)   . Hypertension     Patient Active Problem List   Diagnosis Date Noted  . Pre-diabetes 05/08/2020  . Lumbar degenerative disc disease 01/25/2017  . Carpal tunnel syndrome, right 01/25/2017  . Dyslipidemia 11/27/2016  . GAD (generalized anxiety disorder) 11/26/2016  . COPD, mild (HCC) 11/26/2016  . Snoring 11/26/2016  . Allergic rhinitis 11/26/2016  . Smoker 11/26/2016  . ESOPHAGEAL REFLUX 04/28/2011  . Essential hypertension 04/02/2011    Past Surgical History:  Procedure Laterality Date  . HERNIA REPAIR         Home Medications    Prior to Admission medications   Medication Sig Start Date End Date Taking? Authorizing Provider  albuterol (VENTOLIN HFA) 108 (90 Base) MCG/ACT inhaler INHALE 2 PUFFS INTO THE LUNGS EVERY 6 HOURS AS NEEDED. 05/08/20   Copland, 05/10/20, MD  ALPRAZolam (XANAX) 0.5 MG tablet TAKE 1/2 TABLET BY MOUTH EVERYDAY AS NEEDED FOR ANXIETY 05/08/20   Copland, 05/10/20, MD  escitalopram (LEXAPRO) 20 MG tablet Take 1 tablet (20 mg total) by mouth daily. 05/08/20   Copland, 05/10/20, MD  fluticasone (FLONASE) 50 MCG/ACT nasal spray PLACE 1 SPRAY INTO BOTH NOSTRILS DAILY. 05/02/20   Copland,  05/04/20, MD  hydrochlorothiazide (HYDRODIURIL) 25 MG tablet Take 1 tablet (25 mg total) by mouth daily. 05/08/20   Copland, 05/10/20, MD  hydrOXYzine (VISTARIL) 50 MG capsule Take one cap PO HS PRN itching 05/29/20   13/10/21, MD  meloxicam (MOBIC) 15 MG tablet One tab PO qAM with breakfast for 2 weeks, then daily prn pain. 01/25/17   03/28/17, MD  predniSONE (DELTASONE) 20 MG tablet Take 2 tablets (40 mg total) by mouth daily with breakfast. 05/06/20   05/08/20, PA-C  rosuvastatin (CRESTOR) 20 MG tablet Take 20 mg by mouth daily.    [provider]  triamcinolone cream (KENALOG) 0.1 % Apply 1 application topically 2 (two) times daily. 05/06/20   05/08/20, PA-C  valsartan (DIOVAN) 320 MG tablet Take 1 tablet (320 mg total) by mouth daily. 05/08/20   Copland, 05/10/20, MD    Family History Family History  Problem Relation Age of Onset  . Hypertension Mother   . Hypertension Father     Social History Social History   Tobacco Use  . Smoking status: Current Every Day Smoker  . Smokeless tobacco: Never Used  Vaping Use  . Vaping Use: Never used  Substance Use Topics  . Alcohol use: Yes  . Drug use: No     Allergies   Patient has no known allergies.   Review of  Systems Review of Systems  Constitutional: Negative for activity change, appetite change, chills, diaphoresis, fatigue and fever.  HENT: Negative for congestion and rhinorrhea.   Eyes: Negative.   Respiratory: Negative for cough, chest tightness and wheezing.   Cardiovascular: Negative.   Gastrointestinal: Negative.   Genitourinary: Negative.   Musculoskeletal: Negative for arthralgias, joint swelling and myalgias.  Skin: Positive for rash.  Hematological: Negative for adenopathy.     Physical Exam Triage Vital Signs ED Triage Vitals  Enc Vitals Group     BP 05/29/20 1329 134/85     Pulse Rate 05/29/20 1329 80     Resp 05/29/20 1329 18     Temp 05/29/20 1329 97.6  F (36.4 C)     Temp Source 05/29/20 1329 Oral     SpO2 05/29/20 1329 96 %     Weight 05/29/20 1331 250 lb (113.4 kg)     Height 05/29/20 1331 6\' 1"  (1.854 m)     Head Circumference --      Peak Flow --      Pain Score 05/29/20 1330 0     Pain Loc --      Pain Edu? --      Excl. in GC? --    No data found.  Updated Vital Signs BP 134/85 (BP Location: Right Arm)   Pulse 80   Temp 97.6 F (36.4 C) (Oral)   Resp 18   Ht 6\' 1"  (1.854 m)   Wt 113.4 kg   SpO2 96%   BMI 32.98 kg/m   Visual Acuity Right Eye Distance:   Left Eye Distance:   Bilateral Distance:    Right Eye Near:   Left Eye Near:    Bilateral Near:     Physical Exam Vitals and nursing note reviewed.  Constitutional:      General: He is not in acute distress. HENT:     Head: Normocephalic.     Right Ear: External ear normal.     Left Ear: External ear normal.     Nose: Nose normal.     Mouth/Throat:     Pharynx: Oropharynx is clear.  Eyes:     Conjunctiva/sclera: Conjunctivae normal.     Pupils: Pupils are equal, round, and reactive to light.  Cardiovascular:     Rate and Rhythm: Normal rate and regular rhythm.     Heart sounds: Normal heart sounds.  Pulmonary:     Breath sounds: Normal breath sounds.  Abdominal:     Tenderness: There is no abdominal tenderness.  Musculoskeletal:        General: No tenderness.     Cervical back: Neck supple.     Right lower leg: No edema.     Left lower leg: No edema.  Lymphadenopathy:     Cervical: No cervical adenopathy.  Skin:    General: Skin is warm and dry.          Comments: Lower anterior extremities have slightly raised irregular pink eruption, somewhat urticarial.  Neurological:     Mental Status: He is alert and oriented to person, place, and time.      UC Treatments / Results  Labs (all labs ordered are listed, but only abnormal results are displayed) Labs Reviewed  POCT CBC W AUTO DIFF ([facility]):  WBC 9.4; LY 30.1; MO 7.3;  GR 62.6; Hgb 15.2; Platelets 252     EKG   Radiology No results found.  Procedures Procedures (including critical care time)  Medications  Ordered in UC Medications  methylPREDNISolone acetate (DEPO-MEDROL) injection 80 mg (80 mg Intramuscular Given 05/29/20 1505)    Initial Impression / Assessment and Plan / UC Course  I have reviewed the triage vital signs and the nursing notes.  Pertinent labs & imaging results that were available during my care of the patient were reviewed by me and considered in my medical decision making (see chart for details).    Normal CBC reassuring.  Etiology of dermatitis not clear. Administered Depo Medrol 80mg  IM  Rx for hydroxyzine 50mg  HS prn. Recommend follow-up with dermatologist   Final Clinical Impressions(s) / UC Diagnoses   Final diagnoses:  Rash and nonspecific skin eruption     Discharge Instructions      Minimize baths/showers.  Use a mild bath soap containing oil such as unscented Dove.  Apply a moisturizing cream or lotion immediately after bathing while still wet, then towel dry.     ED Prescriptions    Medication Sig Dispense Auth. Provider   hydrOXYzine (VISTARIL) 50 MG capsule Take one cap PO HS PRN itching 10 capsule , MD        , MD 06/02/20 1229

## 2020-05-29 NOTE — ED Triage Notes (Signed)
Patient returns with rash on legs after having been seen 05/06/20 and symptoms cleared from those areas; now slightly different spots on legs and very itchy. Taking zyrtec and using topicals without relief. Has had covid vaccinations.

## 2020-05-29 NOTE — Discharge Instructions (Addendum)
Minimize baths/showers.  Use a mild bath soap containing oil such as unscented Dove.  Apply a moisturizing cream or lotion immediately after bathing while still wet, then towel dry.  

## 2020-06-05 ENCOUNTER — Other Ambulatory Visit: Payer: Self-pay | Admitting: Family Medicine

## 2020-06-05 MED FILL — ROSUVASTATIN CALCIUM 20 MG: 20 | 90 days supply | Qty: 90 | Fill #0

## 2020-06-05 MED FILL — HYDROCHLOROTHIAZIDE 25 MG T: 25 | 90 days supply | Qty: 90 | Fill #0

## 2020-07-05 ENCOUNTER — Ambulatory Visit: Payer: PRIVATE HEALTH INSURANCE | Attending: Internal Medicine

## 2020-07-05 ENCOUNTER — Other Ambulatory Visit (HOSPITAL_BASED_OUTPATIENT_CLINIC_OR_DEPARTMENT_OTHER): Payer: Self-pay | Admitting: Internal Medicine

## 2020-07-05 DIAGNOSIS — Z23 Encounter for immunization: Secondary | ICD-10-CM

## 2020-07-05 MED FILL — ESCITALOPRAM 20 MG TABLET: 20 | 90 days supply | Qty: 90 | Fill #0

## 2020-07-05 NOTE — Progress Notes (Signed)
   Covid-19 Vaccination Clinic  Name:  KAMAAL CAST    MRN: [id number] DOB: [birth year]  07/05/2020  Mr. Borges was observed post Covid-19 immunization for 15 minutes without incident. He was provided with Vaccine Information Sheet and instruction to access the V-Safe system.   Mr. Deakins was instructed to call 911 with any severe reactions post vaccine: Marland Kitchen Difficulty breathing  . Swelling of face and throat  . A fast heartbeat  . A bad rash all over body  . Dizziness and weakness   Immunizations Administered    Name Date Dose VIS Date Route   Moderna Covid-19 Booster Vaccine 07/05/2020 12:52 PM 0.25 mL 05/08/2020 Intramuscular   Manufacturer: Moderna   Lot: [id number]   NDC: 06015-615-37

## 2020-07-08 MED FILL — MODERNA COVID-19 VACCINE 10: 100 | 1 days supply | Qty: 0 | Fill #0

## 2020-08-08 MED FILL — VALSARTAN 320 MG TABLET: 320 | 90 days supply | Qty: 90 | Fill #0

## 2020-09-19 ENCOUNTER — Telehealth: Payer: Self-pay

## 2020-09-19 DIAGNOSIS — I10 Essential (primary) hypertension: Secondary | ICD-10-CM

## 2020-09-19 MED ORDER — HYDROCHLOROTHIAZIDE 25 MG PO TABS: 25.0000 mg | ORAL_TABLET | Freq: Every day | ORAL | 1 refills | Status: DC

## 2020-09-19 MED ORDER — ROSUVASTATIN CALCIUM 20 MG PO TABS
20.0000 mg | ORAL_TABLET | Freq: Every day | ORAL | 2 refills | Status: DC
Start: 2020-09-19 — End: 2021-07-31

## 2020-09-19 NOTE — Telephone Encounter (Signed)
Medications sent to pharmacy

## 2020-09-19 NOTE — Telephone Encounter (Signed)
Pt called requesting refill on Crestor 20 mg and also stated he will eventually need refills on hydrochlorothiazide 25 mg tabs, but is currently ok on that prescription.  He needs the crestor and all future refill on his meds to go to the [facility] pharmacy inside [facility].  He did not provide a phone number for this pharmacy.

## 2021-01-14 ENCOUNTER — Other Ambulatory Visit (HOSPITAL_BASED_OUTPATIENT_CLINIC_OR_DEPARTMENT_OTHER): Payer: Self-pay

## 2021-06-10 ENCOUNTER — Other Ambulatory Visit: Payer: Self-pay | Admitting: Family Medicine

## 2021-06-10 DIAGNOSIS — F411 Generalized anxiety disorder: Secondary | ICD-10-CM

## 2021-07-16 ENCOUNTER — Other Ambulatory Visit: Payer: Self-pay | Admitting: Family Medicine

## 2021-07-16 DIAGNOSIS — I10 Essential (primary) hypertension: Secondary | ICD-10-CM

## 2021-07-17 ENCOUNTER — Other Ambulatory Visit: Payer: Self-pay | Admitting: Family Medicine

## 2021-07-17 DIAGNOSIS — F411 Generalized anxiety disorder: Secondary | ICD-10-CM

## 2021-07-29 NOTE — Patient Instructions (Addendum)
It was good to see you again today, I will be in touch with your labs asap Referral sent to  [facility] [address], [place] [address] [phone]   Pulmonology, Allergy and Sleep  [address] [place], [place] [address] [phone]  Try some voltaren gel on your hand

## 2021-07-29 NOTE — Progress Notes (Addendum)
[facility] [address], [address] [place], [place] [address] 613 [phone] [phone]  Date:  07/31/2021   Name:  Randall Swanson   DOB:  10-26-1975   MRN:  [id number]  PCP:  Pearline Cablesopland, Breena Bevacqua C, MD    Chief Complaint: Annual Exam (Concerns/ questions: wrist, hand, back pain- needs referral/Flu shot today:yes/PNA: none/Colonoscopy: none recent)   History of Present Illness:  Randall HiltsBobby R Bickford is a 46 y.o. 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  They have 3 daughters Youngest daughter got the flu over the holidays I believe his wife Sharyl NimrodMeredith is a Physiological scientistvaccine coordinator  Can give pneumonia booster due to history of COPD Colon cancer screening; patient reported colonoscopy already, repeat planned for age 46 COVID-19 booster Flu vaccine- give today   He would like to see pulmonology in [facility] so he can get back on his CPAP for OSA He also would like to be seen for right hand problems - he is having more pain, numbness for the last 6 months - his hand is difficult to use in his work as a Investment banker, operationalchef  He is having pain all over the hand  His use of this hand is limited due to pain   Albuterol as needed Flonase HCTZ 25 Valsartan 320 Lexapro 20 Crestor 20 Patient Active Problem List   Diagnosis Date Noted   Pre-diabetes 05/08/2020   Lumbar degenerative disc disease 01/25/2017   Carpal tunnel syndrome, right 01/25/2017   Dyslipidemia 11/27/2016   GAD (generalized anxiety disorder) 11/26/2016   COPD, mild (HCC) 11/26/2016   Snoring 11/26/2016   Allergic rhinitis 11/26/2016   Smoker 11/26/2016   ESOPHAGEAL REFLUX 04/28/2011   Essential hypertension 04/02/2011    Past Medical History:  Diagnosis Date   Acid reflux     COPD (chronic obstructive pulmonary disease) (HCC)    Hypertension     Past Surgical History:  Procedure Laterality Date   HERNIA REPAIR      Social History   Tobacco Use   Smoking status: Every Day   Smokeless tobacco: Never  Vaping Use   Vaping Use: Never used  Substance Use Topics   Alcohol use: Yes   Drug use: No    Family History  Problem Relation Age of Onset   Hypertension Mother    Hypertension Father     Not on File  Medication list has been reviewed and updated.  Current Outpatient Medications on File Prior to Visit  Medication Sig Dispense Refill   albuterol (VENTOLIN HFA) 108 (90 Base) MCG/ACT inhaler INHALE 2 PUFFS INTO THE LUNGS EVERY 6 HOURS AS NEEDED. 18 g 9   escitalopram (LEXAPRO) 20 MG tablet Take 1 tablet (20 mg total) by mouth daily. 30 tablet 0   hydrochlorothiazide (HYDRODIURIL) 25 MG tablet Take 1 tablet (25 mg total) by mouth daily. 90 tablet 1   hydrOXYzine (VISTARIL) 50 MG capsule Take one cap PO HS PRN itching 10 capsule 1   rosuvastatin (CRESTOR) 20 MG tablet Take 1 tablet (20 mg total) by mouth daily. 90 tablet 2   valsartan (DIOVAN) 320 MG tablet Take 1 tablet by mouth daily. 90 tablet 0   No current facility-administered medications on file prior to visit.    Review  of Systems:  As per HPI- otherwise negative.   Physical Examination: Vitals:   07/31/21 1259  BP: 118/62  Pulse: 64  Resp: 18  Temp: 97.6 F (36.4 C)  SpO2: 97%   Vitals:   07/31/21 1259  Weight: 249 lb 6.4 oz (113.1 kg)  Height: 6\' 1"  (1.854 m)   Body mass index is 32.9 kg/m. Ideal Body Weight: Weight in (lb) to have BMI = 25: 189.1  GEN: no acute distress.  Obese, looks well  HEENT: Atraumatic, Normocephalic.  Bilateral TM wnl, oropharynx normal.  PEERL,EOMI.   Ears and Nose: No external deformity. CV: RRR, No M/G/R. No JVD. No thrill. No extra heart sounds. PULM: CTA B, no wheezes, crackles, rhonchi. No retractions. No resp. distress. No accessory  muscle use. ABD: S, NT, ND, +BS. No rebound. No HSM. EXTR: No c/c/e PSYCH: Normally interactive. Conversant.    Assessment and Plan: Physical exam  Essential hypertension - Plan: CBC, Comprehensive metabolic panel, hydrochlorothiazide (HYDRODIURIL) 25 MG tablet, valsartan (DIOVAN) 320 MG tablet  COPD, mild (HCC) - Plan: albuterol (VENTOLIN HFA) 108 (90 Base) MCG/ACT inhaler  GAD (generalized anxiety disorder) - Plan: escitalopram (LEXAPRO) 20 MG tablet, ALPRAZolam (XANAX) 0.5 MG tablet  Screening for hyperlipidemia - Plan: Lipid panel  Pre-diabetes - Plan: Hemoglobin A1c  Screening for prostate cancer - Plan: PSA  Need for influenza vaccination - Plan: Flu Vaccine QUAD 6+ mos PF IM (Fluarix Quad PF)  OSA (obstructive sleep apnea) - Plan: Ambulatory referral to Pulmonology  Chronic pain of right hand - Plan: Ambulatory referral to Hand Surgery  Dyslipidemia - Plan: rosuvastatin (CRESTOR) 20 MG tablet  Immunization due - Plan: Pneumococcal conjugate vaccine 20-valent (Prevnar 20)  Physical exam today.  Encouraged healthy diet and exercise routine Will plan further follow- up pending labs. Gave flu and pneumonia booster Referral to pulmonology and hand surgery  Meds ordered this encounter  Medications   hydrochlorothiazide (HYDRODIURIL) 25 MG tablet    Sig: Take 1 tablet (25 mg total) by mouth daily.    Dispense:  90 tablet    Refill:  3   valsartan (DIOVAN) 320 MG tablet    Sig: Take 1 tablet (320 mg total) by mouth daily.    Dispense:  90 tablet    Refill:  3   escitalopram (LEXAPRO) 20 MG tablet    Sig: Take 1 tablet (20 mg total) by mouth daily.    Dispense:  90 tablet    Refill:  3   rosuvastatin (CRESTOR) 20 MG tablet    Sig: Take 1 tablet (20 mg total) by mouth daily.    Dispense:  90 tablet    Refill:  3   albuterol (VENTOLIN HFA) 108 (90 Base) MCG/ACT inhaler    Sig: INHALE 2 PUFFS INTO THE LUNGS EVERY 6 HOURS AS NEEDED.    Dispense:  18 g    Refill:  9    ALPRAZolam (XANAX) 0.5 MG tablet    Sig: TAKE 1/2 TABLET BY MOUTH EVERYDAY AS NEEDED FOR ANXIETY    Dispense:  30 tablet    Refill:  0      Signed , MD  Received labs 1/13- letter to pt  Results for orders placed or performed in visit on 07/31/21  CBC  Result Value Ref Range   WBC 9.0 4.0 - 10.5 K/uL   RBC 5.17 4.22 - 5.81 Mil/uL   Platelets 260.0 150.0 - 400.0 K/uL   Hemoglobin 15.3 13.0 - 17.0 g/dL  HCT 44.9 39.0 - 52.0 %   MCV 86.7 78.0 - 100.0 fl   MCHC 34.1 30.0 - 36.0 g/dL   RDW 23.3 00.7 - 62.2 %  Comprehensive metabolic panel  Result Value Ref Range   Sodium 137 135 - 145 mEq/L   Potassium 3.9 3.5 - 5.1 mEq/L   Chloride 99 96 - 112 mEq/L   CO2 28 19 - 32 mEq/L   Glucose, Bld 77 70 - 99 mg/dL   BUN 14 6 - 23 mg/dL   Creatinine, Ser 6.33 0.40 - 1.50 mg/dL   Total Bilirubin 0.4 0.2 - 1.2 mg/dL   Alkaline Phosphatase 66 39 - 117 U/L   AST 24 0 - 37 U/L   ALT 32 0 - 53 U/L   Total Protein 6.8 6.0 - 8.3 g/dL   Albumin 4.5 3.5 - 5.2 g/dL   GFR 354.56 >25.63 mL/min   Calcium 9.5 8.4 - 10.5 mg/dL  Hemoglobin S9H  Result Value Ref Range   Hgb A1c MFr Bld 6.2 4.6 - 6.5 %  Lipid panel  Result Value Ref Range   Cholesterol 135 0 - 200 mg/dL   Triglycerides (H) 0.0 - 149.0 mg/dL    734.2 Triglyceride is over 400; calculations on Lipids are invalid.   HDL 30.50 (L) >39.00 mg/dL   Total CHOL/HDL Ratio 4   PSA  Result Value Ref Range   PSA 0.55 0.10 - 4.00 ng/mL  LDL cholesterol, direct  Result Value Ref Range   Direct LDL 77.0 mg/dL

## 2021-07-31 ENCOUNTER — Ambulatory Visit (INDEPENDENT_AMBULATORY_CARE_PROVIDER_SITE_OTHER): Payer: PRIVATE HEALTH INSURANCE | Admitting: Family Medicine

## 2021-07-31 ENCOUNTER — Encounter: Payer: Self-pay | Admitting: Family Medicine

## 2021-07-31 VITALS — BP 118/62 | HR 64 | Temp 97.6°F | Resp 18 | Ht 73.0 in | Wt 249.4 lb

## 2021-07-31 DIAGNOSIS — I10 Essential (primary) hypertension: Secondary | ICD-10-CM | POA: Diagnosis not present

## 2021-07-31 DIAGNOSIS — G8929 Other chronic pain: Secondary | ICD-10-CM

## 2021-07-31 DIAGNOSIS — R7303 Prediabetes: Secondary | ICD-10-CM

## 2021-07-31 DIAGNOSIS — F411 Generalized anxiety disorder: Secondary | ICD-10-CM

## 2021-07-31 DIAGNOSIS — Z23 Encounter for immunization: Secondary | ICD-10-CM

## 2021-07-31 DIAGNOSIS — Z125 Encounter for screening for malignant neoplasm of prostate: Secondary | ICD-10-CM

## 2021-07-31 DIAGNOSIS — Z0000 Encounter for general adult medical examination without abnormal findings: Secondary | ICD-10-CM

## 2021-07-31 DIAGNOSIS — J449 Chronic obstructive pulmonary disease, unspecified: Secondary | ICD-10-CM | POA: Diagnosis not present

## 2021-07-31 DIAGNOSIS — M79641 Pain in right hand: Secondary | ICD-10-CM

## 2021-07-31 DIAGNOSIS — Z13220 Encounter for screening for lipoid disorders: Secondary | ICD-10-CM

## 2021-07-31 DIAGNOSIS — G4733 Obstructive sleep apnea (adult) (pediatric): Secondary | ICD-10-CM

## 2021-07-31 DIAGNOSIS — E785 Hyperlipidemia, unspecified: Secondary | ICD-10-CM

## 2021-07-31 MED ORDER — ALPRAZOLAM 0.5 MG PO TABS: ORAL_TABLET | ORAL | 0 refills | Status: AC

## 2021-07-31 MED ORDER — ALBUTEROL SULFATE HFA 108 (90 BASE) MCG/ACT IN AERS: INHALATION_SPRAY | RESPIRATORY_TRACT | 9 refills | Status: DC

## 2021-07-31 MED ORDER — ROSUVASTATIN CALCIUM 20 MG PO TABS: 20.0000 mg | ORAL_TABLET | Freq: Every day | ORAL | 3 refills | Status: DC

## 2021-07-31 MED ORDER — VALSARTAN 320 MG PO TABS: 320.0000 mg | ORAL_TABLET | Freq: Every day | ORAL | 3 refills | Status: DC

## 2021-07-31 MED ORDER — HYDROCHLOROTHIAZIDE 25 MG PO TABS: 25.0000 mg | ORAL_TABLET | Freq: Every day | ORAL | 3 refills | Status: DC

## 2021-07-31 MED ORDER — ESCITALOPRAM OXALATE 20 MG PO TABS: 20.0000 mg | ORAL_TABLET | Freq: Every day | ORAL | 3 refills | Status: DC

## 2021-08-01 LAB — CBC
HCT: 44.9 % (ref 39.0–52.0)
Hemoglobin: 15.3 g/dL (ref 13.0–17.0)
MCHC: 34.1 g/dL (ref 30.0–36.0)
MCV: 86.7 fl (ref 78.0–100.0)
Platelets: 260 10*3/uL (ref 150.0–400.0)
RBC: 5.17 Mil/uL (ref 4.22–5.81)
RDW: 13.3 % (ref 11.5–15.5)
WBC: 9 10*3/uL (ref 4.0–10.5)

## 2021-08-01 LAB — COMPREHENSIVE METABOLIC PANEL
ALT: 32 U/L (ref 0–53)
AST: 24 U/L (ref 0–37)
Albumin: 4.5 g/dL (ref 3.5–5.2)
Alkaline Phosphatase: 66 U/L (ref 39–117)
BUN: 14 mg/dL (ref 6–23)
CO2: 28 mEq/L (ref 19–32)
Calcium: 9.5 mg/dL (ref 8.4–10.5)
Chloride: 99 mEq/L (ref 96–112)
Creatinine, Ser: 0.82 mg/dL (ref 0.40–1.50)
GFR: 104.93 mL/min (ref >60.00)
Glucose, Bld: 77 mg/dL (ref 70–99)
Potassium: 3.9 mEq/L (ref 3.5–5.1)
Sodium: 137 mEq/L (ref 135–145)
Total Bilirubin: 0.4 mg/dL (ref 0.2–1.2)
Total Protein: 6.8 g/dL (ref 6.0–8.3)

## 2021-08-01 LAB — LDL CHOLESTEROL, DIRECT: Direct LDL: 77 mg/dL

## 2021-08-01 LAB — PSA: PSA: 0.55 ng/mL (ref 0.10–4.00)

## 2021-08-01 LAB — HEMOGLOBIN A1C: Hgb A1c MFr Bld: 6.2 % (ref 4.6–6.5)

## 2021-08-01 LAB — LIPID PANEL
Cholesterol: 135 mg/dL (ref 0–200)
HDL: 30.5 mg/dL — ABNORMAL LOW (ref >39.00)
Total CHOL/HDL Ratio: 4
Triglycerides: 485 mg/dL — ABNORMAL HIGH (ref 0.0–149.0)

## 2021-08-07 ENCOUNTER — Other Ambulatory Visit (HOSPITAL_BASED_OUTPATIENT_CLINIC_OR_DEPARTMENT_OTHER): Payer: Self-pay

## 2021-08-07 MED ORDER — METHYLPREDNISOLONE 4 MG PO TBPK
ORAL_TABLET | ORAL | 0 refills | Status: DC
  Filled 2021-08-07: qty 21, 6d supply, fill #0

## 2021-08-11 ENCOUNTER — Other Ambulatory Visit (HOSPITAL_BASED_OUTPATIENT_CLINIC_OR_DEPARTMENT_OTHER): Payer: Self-pay

## 2021-10-22 ENCOUNTER — Encounter: Payer: Self-pay | Admitting: *Deleted

## 2021-10-22 DIAGNOSIS — Z006 Encounter for examination for normal comparison and control in clinical research program: Secondary | ICD-10-CM

## 2021-10-22 NOTE — Research (Addendum)
Spoke with Randall Swanson about [employer] study. Emailed him info about the study, per his request. Dr Patsy Lager gave ok to contact patient.  ?

## 2021-10-27 ENCOUNTER — Other Ambulatory Visit: Payer: Self-pay

## 2021-10-27 ENCOUNTER — Encounter: Payer: Self-pay | Admitting: *Deleted

## 2021-10-27 DIAGNOSIS — Z006 Encounter for examination for normal comparison and control in clinical research program: Secondary | ICD-10-CM

## 2021-10-27 DIAGNOSIS — I10 Essential (primary) hypertension: Secondary | ICD-10-CM

## 2021-10-27 MED ORDER — VALSARTAN 320 MG PO TABS: 320.0000 mg | ORAL_TABLET | Freq: Every day | ORAL | 3 refills | Status: DC

## 2021-10-27 NOTE — Patient Instructions (Signed)
Spoke with Mr Coffer he is interested in coming in for run in screening with [facility] research appointment made for April 18th at 0900 directions, code for parking and informed him to be npo that morning for lab work. Voices understanding.  ?

## 2021-11-03 ENCOUNTER — Encounter: Payer: Self-pay | Admitting: *Deleted

## 2021-11-03 DIAGNOSIS — Z006 Encounter for examination for normal comparison and control in clinical research program: Secondary | ICD-10-CM

## 2021-11-03 NOTE — Research (Signed)
Message left for Randall Swanson to remind him of his appointment tomorrow at 0900 gave him the parking code, and nothing to eat or drink tomorrow 10 hours before blood draw. Encouraged to call me back with any questions.  ?

## 2021-11-04 ENCOUNTER — Other Ambulatory Visit: Payer: Self-pay

## 2021-11-04 ENCOUNTER — Encounter: Payer: PRIVATE HEALTH INSURANCE | Admitting: *Deleted

## 2021-11-04 VITALS — BP 117/71 | HR 61 | Temp 97.9°F | Resp 18 | Ht 73.0 in | Wt 248.2 lb

## 2021-11-04 DIAGNOSIS — Z006 Encounter for examination for normal comparison and control in clinical research program: Secondary | ICD-10-CM

## 2021-11-04 NOTE — Research (Addendum)
[place] Screening run in 04-Nov-2021   Apolipoprotein  B48  1.33 mg/dL                 '[]' Clinically Significant  '[x]' Not Clinically Significant   Any further action needed to be taken per the PI?  No  Randall Casino, MD, [facility], [place] Director of the Advanced Lipid Disorders &  Cardiovascular Risk Reduction Clinic Diplomate of the American Board of Clinical Lipidology Attending Cardiologist  Direct Dial: [phone]  Fax: [phone]  Website:  [email or web address]      Essence Consent     Subject Name: Randall Swanson  Subject met inclusion and exclusion criteria.  The informed consent form, study requirements and expectations were reviewed with the subject and questions and concerns were addressed prior to the signing of the consent form.  The subject verbalized understanding of the trial requirements.  The subject agreed to participate in the Core  trial and signed the informed consent at [place] on 04-November-2021.  The informed consent was obtained prior to performance of any protocol-specific procedures for the subject.  A copy of the signed informed consent was given to the subject and a copy was placed in the subject's medical record.   Randall Swanson  Protocol number 1 Consent version  2 Essence  [id number]    Site [id number]  SUBJECT ID:    [id number]                     DATE:  04-November-2021        '[x]'  MALE                            '[]'  MALE AGE: ETHINICITY:   '[]'  HISPANIC/LATINO      '[]'  NON- HISPANIC/LATINO RACE:           '[x]'   WHITE             '[]'  BLACK/AFRICAN AMERICAN                         '[]'   ASIAN              '[]'  AMERICAN INDIAN/[place] NATIVE                         '[]'   NATIVE [place]/OTHER PACIFIC ISLANDER                         '[]'   OTHER  FUTURE RESEARCH '[x]'  USE OF SAMPLES FOR FUTURE RESEARCH '[x]'  Consented for Sub study CTA     Mr Randall Swanson was given time to ask questions before signing consent. Physical  exam preformed by Dr Randall Swanson.  Vs taken at 0924  Blood work drawn at 1010 Urine obtained at 1023.Spoke with  Mr Randall Swanson about Child  bearing potential. He states that his wife has had a tubal, but a clamp came off and she also takes birth control. Informed him if she were to become pregnant to call us right away. Voices understanding.  Meds reviewed with pt no changes other than no longer takes medrol. GFR -104.93, No allergy to contrast per pt, No hx CABG, Does not have any contraindication to NTG, and states he can hold his breath longer than 6 seconds.    Takes Valsartan for hypertension started  04-20-16 Hydrodiuril for hypertension started 04-20-16 Lexapro for anxiety started 04-20-16 Crestor for Dyslipidemia started 12-30-17 Albuterol inhaler for mild COPD started 07-31-16 Xanax for anxiety started 10-18-15 Vistaril for itching started 05-29-20     Current Outpatient Medications:    albuterol (VENTOLIN HFA) 108 (90 Base) MCG/ACT inhaler, INHALE 2 PUFFS INTO THE LUNGS EVERY 6 HOURS AS NEEDED., Disp: 18 g, Rfl: 9   ALPRAZolam (XANAX) 0.5 MG tablet, TAKE 1/2 TABLET BY MOUTH EVERYDAY AS NEEDED FOR ANXIETY, Disp: 30 tablet, Rfl: 0   escitalopram (LEXAPRO) 20 MG tablet, Take 1 tablet (20 mg total) by mouth daily., Disp: 90 tablet, Rfl: 3   hydrochlorothiazide (HYDRODIURIL) 25 MG tablet, Take 1 tablet (25 mg total) by mouth daily., Disp: 90 tablet, Rfl: 3   hydrOXYzine (VISTARIL) 50 MG capsule, Take one cap PO HS PRN itching, Disp: 10 capsule, Rfl: 1   rosuvastatin (CRESTOR) 20 MG tablet, Take 1 tablet (20 mg total) by mouth daily., Disp: 90 tablet, Rfl: 3   valsartan (DIOVAN) 320 MG tablet, Take 1 tablet (320 mg total) by mouth daily., Disp: 90 tablet, Rfl: 3   methylPREDNISolone (MEDROL DOSEPAK) 4 MG TBPK tablet, Take 6 tablets by mouth on day 1, then 5 tablets on day 2, then 4 tablets on day 3, then 3 tablets on day 4, then 2 tablets on day 5, then 1 tablet on day 6. (Patient not taking: Reported on  11/04/2021), Disp: 21 tablet, Rfl: 0   INCLUSION CRITERIA Consent      '[x]'    Pregnancy authorization '[]'   AGE 37 or greater '[x]'   Triglycerides fasting 150 or greater with either: '[x]'   Dx of ASCVD (CAD, CVA, PAD) OR '[x]'   Increased risk for ASCVD as below '[x]'   Type 2 DM OR 2 or more below '[]'   Men 11 or greater Woman 66 or greater '[]'  '[]'    Woman with Hx of preeclampsia or premature menopause (before 68) '[]'    Family Hx of premature ASCDD (Before 8 for males, or before 55 for females  '[]'    Current Tobacco use  '[x]'    Metabolic syndrome '[x]'    Hypertension with Treatment  '[x]'    CKD stage 3 Or (GFR 30-59)  '[]'    LDL-C 160 or greater LDL-C 100 or greater on therapy to lower  '[]'    Elevated high-sensitivity C Reactive protein (>2.0)  '[]'   Elevated lipoprotein (a) (>57m/dL or 124nmol/L) OR  '[]'   Triglycerides fasting 500 or greater  '[]'   Lipid-lowering med (for at least 4 weeks) [place] to comply with diet and lifestyle recommendations  '[x]'   Females must be non-pregnant and non-lactating and EITHER  '[x]'   Surgically sterile, post-menopausal, abstinent OR Use highly effective contraceptive at time of consent until at least 30 weeks after last dose of study drug  '[x]'   Males must be surgical sterile, abstinent or using a highly effective contraceptive at time of consent until at least 30 weeks after the last dose of study drug   '[]'     EXCLUSION CRITERIA           N/A                                            '[x]'  Major surgery, peripheral revascularization, or non-urgent PCI within 3 months prior to screening, or planned major surgery or major procedure during the study '[]'   Active pancreatitis within  4 weeks prior to screening '[]'   Acute coronary syndrome or CVA/TIA within 3 months of screening '[]'   Screening labs: ALT or AST >3.0 x ULN Total bilirubin >1.5 ULN unless due to Gilbert's syndrome GFR >30 Urine Protein/creatine ratio >500 Uncontrolled HTN (BP>180/100 despite  Treatment Uncontrolled hypothyroidism TSH>1.5 and T4 < LLN, or Hormone therapy not stable for 4 weeks or greater  '[]'  '[]'  '[]'  '[]'  '[]'  '[]'   DM newly dx within 12 weeks of screening A1c > 9.5 at screening  '[]'  '[]'   Change in basal insulin >20% within 3 months prior to screening  '[]'   Type 1 Dm: episode of DKA or > 3 episodes of severe hypo glycerides with on 6 months prior to screening   Active infections, HIV, Hep C, Hep B '[]'   Active infection requiring systemic antiviral or antimicrobial tx that will not be complete prior to study day 1 or active Covid 19 infection not resolved by study day 1 '[]'   Malignancy within 5 years (except for non-melanoma skin ca, cervical in situ ca, breast ductal ca in situ or stage 1 prostate Ca that has been tx '[]'   Hypersensitivity to the active substance (olezarsen or placebo) '[]'   Tx with another investigational drug or devise within 1 month or screening  '[]'   Previous tx with an oligonucleotide within 4 months of screening '[]'   Con meds/ procedure restrictions:   Systemic corticosteroids of anabolic steroids within 6 weeks prior to screening and during the study unless approved   '[]'   Use of bile acids resins (colestipol or Colesevelam) within 4 weeks prior to screening or planned during the study  '[]'   Plasma apheresis within 4 weeks prior to screening or planned during the study  '[]'   Change in meds known to exacerbate hypertriglyceridemia (beta blockers, thiazides, isotretinoin, oral antidiabetic meds, tamoxifen, estrogens or progestins within 4 weeks prior to screening  '[]'    Change or expected need for significant change in titration of therapies known to significantly reduce TG (GLP-1 agonists, other incretin mimetics, Phentermine/topiramate, naltrexone/bupropion, Xenical, or bariatric surgery within 3 months prior to screening  '[]'    Change in antipsychotic meds within 30 days of screening or >432m within 60 days of Screening  '[]'    Blood or plasma donation  of 50-4991mwithin 30 days of screening or>499 within 60 days of screening '[]'   Unwilling to comply with procedures, following up, or unwillingness to cooperate fully with the investigator '[]'   ETOH abuse or recent (<1 year) or other substance abuse          Screening Run-In Clinic Visit Essence     Date of Visit:04-November-2021  Subject #: [id number]   During this visit the following activities were completed:  '[x]' Reading, Signing and Understanding the informed Consent   '[x]' Review Inclusion/Exclusion Criteria  '[x]' Vital Signs, Height, & Weight:  - Blood pressure:  117/71(Subject sat supine for at least 5 minutes before blood pressure was performed) - Heart rate:61 - Temperature:97.918 - Respiratory Rate: - Oxygen Saturation:95% - Weight:247.8 - Height: 15f61fin   '[x]' Physical Exam done by PI or Sub-I- Dr StuLia Swanson'[x]' Review Subjects Medical History & Concomitant Medications  '[x]' Review Any Adverse Events/ Serious Adverse Events  '[x]' Review of any ER Visits, Hospitalizations and Inpatient Days  '[x]' 12-Lead ECG (Subject sat supine for at least 5 minutes before this was performed)  *All ECG's completed will be available in subjects binder   '[x]'  Subject fasting   '[x]'  Blood and Urine specimens collected per protocol    [  x]Extended Urinalysis/Pregnancy Test (if woman of childbearing age)  '[x]' Diet/Lifestyle/Alcohol Counseling with Subject   '[x]' Education on the importance of complying with contraception precautions during study with subject agreement

## 2021-11-05 ENCOUNTER — Other Ambulatory Visit (HOSPITAL_COMMUNITY): Payer: Self-pay | Admitting: Internal Medicine

## 2021-11-05 ENCOUNTER — Other Ambulatory Visit: Payer: Self-pay | Admitting: Internal Medicine

## 2021-11-06 NOTE — Progress Notes (Signed)
Patient seen as part of pre screening for Essence.  Patient is a 46 year old male followed by Dr. Lorelei Pont.  His spouse is a Marine scientist at [employer], and according to the patient, has been actively involved in medical decision making with him.  She apparently has read the protocol.  He has COPD, HTN, dyslipidemia with elevated triglycerides, and low HDL.  He is a Biomedical scientist, and has OSA, and currently is not on CPAP.  He has had an elevated HgB A1c.  He continues to smoke.  ? ?BP 117/71 P 61 Temp 97.9 R18 ?No JVD.  No carotid bruits or obvious neck masses.  ?Lungs with slightly prolonged expiration.   ?Cardiac regular without murmurs.  No gallops ?Abd soft ?Ext no edema ?Neurologic is non focal.  ? ?ECG - SB ? ?I have extensively reviewed lifestyle modification, impact of smoking, obesity, hypertension, dyslipidemia on long term outcome. We reviewed the potential for DM, and elevated triglycerides, reviewing pancreatitis.  I did express to him that much of his syndrome could be dramatically modified by changes to weight, smoking, with likely reduction in medication burden.  ? ?We reviewed the nature of the investigational drug.  We also discussed in detail his current pregnancy risk as his spouse apparently had a clip come off post Bilat Tubal ligation, but that she is on medication to prevent pregnancy and the likelihood is low.  ? ?He meets inclusion and exclusion criteria for the Essence protocol, and desires to participate.   ? ?Loretha Brasil. Lia Foyer, MD, [facility] ?Medical Director, [facility]  ? ? ?

## 2021-11-10 ENCOUNTER — Encounter: Payer: Self-pay | Admitting: *Deleted

## 2021-11-10 DIAGNOSIS — Z006 Encounter for examination for normal comparison and control in clinical research program: Secondary | ICD-10-CM

## 2021-11-10 NOTE — Research (Addendum)
Randall Swanson ?MRN [id number] ?Run in visit 04-November-2021 ?Received labs today 10-November-2021 ? ? ? ? ? ? ? ? ? ? ? ? ? ? ?Essence Abnormal Lab report 04-November-2021 ? ? ?Chemistry: ?Creatinine  Kinase  300 U/L                    [x] Clinically Significant  [] Not Clinically Significant ?Insulin 26.1                                             [] Clinically Significant  [x] Not Clinically Significant ? ? ?Lipids:  ?Triglyceride     180  mg/dL                     [] Clinically Significant  [x] Not Clinically Significant ?HDL Cholesterol 32 mg/dL                    [] Clinically Significant  [x] Not Clinically Significant ? ? ?Any further action needed to be taken per the PI?  YES ? ?CK elevated. What is the trend - was this elevated before? ? ? , MD, [facility], FACP  ?[place]  [facility]  ?Medical Director of the Advanced Lipid Disorders &  ?Cardiovascular Risk Reduction Clinic ?Diplomate of the of Clinical Lipidology ?Attending Cardiologist  ?Direct Dial: [phone]  Fax: [phone]  ?Website:  [email or web address] ? ? ? ? ?

## 2021-11-10 NOTE — Research (Signed)
Spoke with Mr Nielson about his lab results. States that he has the CTA scheduled for May 9th at 1100. ?

## 2021-11-13 NOTE — Research (Signed)
Run in visit Ty Hilts ?04-November-2021 ? ? ?

## 2021-11-17 ENCOUNTER — Other Ambulatory Visit: Payer: Self-pay

## 2021-11-17 ENCOUNTER — Telehealth: Payer: Self-pay | Admitting: Family Medicine

## 2021-11-17 ENCOUNTER — Other Ambulatory Visit (HOSPITAL_BASED_OUTPATIENT_CLINIC_OR_DEPARTMENT_OTHER): Payer: Self-pay

## 2021-11-17 ENCOUNTER — Telehealth: Payer: Self-pay

## 2021-11-17 DIAGNOSIS — U071 COVID-19: Secondary | ICD-10-CM

## 2021-11-17 MED ORDER — NIRMATRELVIR/RITONAVIR (PAXLOVID)TABLET
3.0000 | ORAL_TABLET | Freq: Two times a day (BID) | ORAL | 0 refills | Status: DC
  Filled 2021-11-17: qty 30, 5d supply, fill #0

## 2021-11-17 MED ORDER — NIRMATRELVIR/RITONAVIR (PAXLOVID)TABLET: 3.0000 | ORAL_TABLET | Freq: Two times a day (BID) | ORAL | 0 refills | Status: AC

## 2021-11-17 NOTE — Telephone Encounter (Signed)
Are you okay with sending this without an OV? ?

## 2021-11-17 NOTE — Telephone Encounter (Signed)
Patient is vaccinated, recent GFR on chart ?We will prescribe Paxlovid ?Please give patient a call back, let him know prescription was sent to the med [facility].  If any distress or if not doing okay please seek care ?

## 2021-11-17 NOTE — Telephone Encounter (Signed)
See below

## 2021-11-17 NOTE — Telephone Encounter (Signed)
Pt aware and voices understanding.   

## 2021-11-17 NOTE — Telephone Encounter (Signed)
Taken care of

## 2021-11-17 NOTE — Telephone Encounter (Signed)
Nurse Assessment ?Nurse: Colon Branch, RN, [name] Date/Time Lamount Cohen Time): 11/16/2021 3:32:35 PM ?Confirm and document reason for call. If ?symptomatic, describe symptoms. ?---Caller states he is COVID positive. He would like to ?get prescription for Paxlovid. Cough, sore throat, body ?aches, cold symptoms. Symptoms started last night. ?Does the patient have any new or worsening ?symptoms? ---Yes ?Will a triage be completed? ---Yes ?Related visit to physician within the last 2 weeks? ---No ?Does the PT have any chronic conditions? (i.e. ?diabetes, asthma, this includes High risk factors for ?pregnancy, etc.) ?---Yes ?List chronic conditions. ---COPD, HTN, high cholesterol ?Is this a behavioral health or substance abuse call? ---No ?Guidelines ?Guideline Title Affirmed Question Affirmed Notes Nurse Date/Time (Eastern ?Time) ?COVID-19 - ?Diagnosed or ?Suspected ?[1] HIGH RISK ?for severe COVID ?complications (e.g., ?weak immune ?system, age > 79 ?years, obesity with ?BMI 30 or higher, ?pregnant, chronic ?lung disease or other ?chronic medical ?Daymon Larsen 11/16/2021 3:33:40 ?PM ?PLEASE NOTE: All timestamps contained within this report are represented as [place] Standard Time. ?CONFIDENTIALTY NOTICE: This fax transmission is intended only for the addressee. It contains information that is legally privileged, confidential or ?otherwise protected from use or disclosure. If you are not the intended recipient, you are strictly prohibited from reviewing, disclosing, copying using ?or disseminating any of this information or taking any action in reliance on or regarding this information. If you have received this fax in error, please ?notify us immediately by telephone so that we can arrange for its return to [place]. Phone: [phone], Toll-Free: [phone], Fax: [phone] ?Page: 2 of 2 ?Call Id: [id number] ?Guidelines ?Guideline Title Affirmed Question Affirmed Notes Nurse Date/Time (Eastern ?Time) ?condition) AND [2] ?COVID  symptoms ?(e.g., cough, fever) ?(Exceptions: Already ?seen by PCP and no ?new or worsening ?symptoms.) ?Disp. Time (Eastern ?Time) Disposition Final User ?11/16/2021 3:36:17 PM Call PCP within 24 Hours Yes Colon Branch, RN, [name] ?Caller Disagree/Comply Comply ?Caller Understands Yes ?PreDisposition Did not know what to do ?Care Advice Given Per Guideline ?CALL PCP WITHIN 24 HOURS: * IF OFFICE WILL BE OPEN: Call the office when it opens tomorrow morning. GENERAL ?CARE ADVICE FOR COVID-19 SYMPTOMS: * The symptoms are generally treated the same whether you have COVID-19, ?influenza or some other respiratory virus. * Feeling dehydrated: Drink extra liquids. If the air in your home is dry, use a humidifier. ?COVID-19 - HOW TO PROTECT OTHERS - WHEN YOU ARE SICK WITH COVID-19: * STAY HOME A MINIMUM OF 5 ?DAYS: People with MILD COVID-19 can STOP HOME ISOLATION AFTER 5 DAYS if (1) fever has been gone for 24 hours ?(without using fever medicine) AND (2) symptoms are better. Continue to wear a well-fitted mask for a full 10 days when around ?others. * WEAR A MASK FOR 10 DAYS: Wear a well-fitted mask for 10 full days any time you are around others inside your home ?or in public. Do not go to places where you are unable to wear a mask. ?Comments ?User: Randall Natal, RN Date/Time Lamount Cohen Time): 11/16/2021 3:37:20 PM ?Caller requested Paxlovid. Advised him that RX's cannot be handled after hrs and requested he call office back in ?AM for possible RX ?

## 2021-11-17 NOTE — Telephone Encounter (Signed)
Pharmacy is needing clarification on paxlovid. Can call number listed.  ?

## 2021-11-20 ENCOUNTER — Encounter: Payer: Self-pay | Admitting: *Deleted

## 2021-11-20 DIAGNOSIS — Z006 Encounter for examination for normal comparison and control in clinical research program: Secondary | ICD-10-CM

## 2021-11-20 NOTE — Research (Addendum)
Spoke with Mr Randall Swanson. He states he tested positive for COVID. He is scheduled for May 9th this will be 11 days after testing positive. Encouraged him to call with any questions. [name] (Essence Monitor) informed. Message sent to [name].  ?

## 2021-11-25 ENCOUNTER — Ambulatory Visit (HOSPITAL_COMMUNITY): Payer: Self-pay

## 2021-12-02 ENCOUNTER — Telehealth (HOSPITAL_COMMUNITY): Payer: Self-pay | Admitting: Emergency Medicine

## 2021-12-02 ENCOUNTER — Encounter: Payer: Self-pay | Admitting: *Deleted

## 2021-12-02 DIAGNOSIS — Z006 Encounter for examination for normal comparison and control in clinical research program: Secondary | ICD-10-CM

## 2021-12-02 NOTE — Telephone Encounter (Signed)
Attempted to call patient regarding upcoming cardiac CT appointment. °Left message on voicemail with name and callback number °Kingslee Mairena RN Navigator Cardiac Imaging °[facility] °[phone] Office °[phone] Cell ° °

## 2021-12-02 NOTE — Research (Signed)
Spoke to Randall Swanson to  remind him of his appointment tomorrow at 0930. Voices understanding.   ?

## 2021-12-02 NOTE — Telephone Encounter (Signed)
Reaching out to patient to offer assistance regarding upcoming cardiac imaging study; pt verbalizes understanding of appt date/time, parking situation and where to check in, pre-test NPO status and medications ordered, and verified current allergies; name and call back number provided for further questions should they arise ?Rockwell Alexandria RN Navigator Cardiac Imaging ?[facility] ?[phone] office ?[phone] cell ? ? ?Arrival 200 ?Denies iv issues ?Daily meds ?Coming from [facility] /research dept for essence trial ?

## 2021-12-03 ENCOUNTER — Other Ambulatory Visit: Payer: Self-pay

## 2021-12-03 ENCOUNTER — Other Ambulatory Visit: Payer: Self-pay | Admitting: Family Medicine

## 2021-12-03 ENCOUNTER — Ambulatory Visit (HOSPITAL_COMMUNITY)
Admission: RE | Admit: 2021-12-03 | Discharge: 2021-12-03 | Disposition: A | Discharge status: Home / Self Care | Payer: Self-pay | Source: Ambulatory Visit | Attending: Internal Medicine | Admitting: Internal Medicine

## 2021-12-03 ENCOUNTER — Encounter: Payer: PRIVATE HEALTH INSURANCE | Admitting: *Deleted

## 2021-12-03 VITALS — BP 113/68 | HR 55 | Temp 97.7°F | Resp 16

## 2021-12-03 DIAGNOSIS — Z006 Encounter for examination for normal comparison and control in clinical research program: Secondary | ICD-10-CM

## 2021-12-03 DIAGNOSIS — E785 Hyperlipidemia, unspecified: Secondary | ICD-10-CM

## 2021-12-03 MED ORDER — IOHEXOL 350 MG/ML SOLN
100.0000 mL | Freq: Once | INTRAVENOUS | Status: AC | PRN
  Administered 2021-12-03: 100 mL via INTRAVENOUS

## 2021-12-03 MED ORDER — NITROGLYCERIN 0.4 MG SL SUBL
SUBLINGUAL_TABLET | SUBLINGUAL | Status: AC
  Filled 2021-12-03: qty 2

## 2021-12-03 MED ORDER — NITROGLYCERIN 0.4 MG SL SUBL
0.8000 mg | SUBLINGUAL_TABLET | Freq: Once | SUBLINGUAL | Status: AC
  Administered 2021-12-03: 0.8 mg via SUBLINGUAL

## 2021-12-03 NOTE — Research (Addendum)
      Essence Qualification visit  Subject Number: [id number]          Date: 12-03-2021  [x] Vital Signs Collected - Blood Pressure:113/68 - Heart Rate:55 - Respiratory Rate:16 - Temperature:97.7 (36-5 C) - Oxygen Saturation: 97%  [x]  Lab collection per protocol  [x]  Assessment of ER Visits, Hospitalizations, and Inpatient Days  [x]  Adverse Events and Concomitant Medications  [x]  Diet, Lifestyle, and Alcohol Counseling   Randall Swanson here for Essence Qualification visit. Reports no ED, Urgent care, or PCP visits. No pain, nausea, vomiting, or abd pain. He did have Covid a few weeks ago, but feels better now. He is having his CTa today at 1430. Reviewed meds no changes noted. Vs taken at 0945 blood drawn at 0950.

## 2021-12-05 NOTE — Research (Addendum)
Essence Qualification Visit  [phone]      ABNORMAL LABS: TEST RESULT  triglycerides 255  HDL 27         Are these clinically significant? []  YES              [x]  NO   , MD, [facility], FACP    [facility]  Medical Director of the Advanced Lipid Disorders &  Cardiovascular Risk Reduction Clinic Diplomate of the American Board of Clinical Lipidology Attending Cardiologist  Direct Dial: [phone]  Fax: [phone]  Website:  [email or web address]

## 2021-12-08 NOTE — Research (Signed)
ESSENCE QUALIFICATION VISIT   S902  WITHIN NORMAL LIMITS

## 2021-12-11 NOTE — Research (Addendum)
Ty Hilts Essence qualification visit 03-Dec-2021      Apolipoprotein B48  1.90  mg/dL   [] Clinically Significant  [x] Not Clinically Significant   Any further action needed to be taken per the PI? No  , MD, [facility], FACP  [place]  [facility]  Medical Director of the Advanced Lipid Disorders &  Cardiovascular Risk Reduction Clinic Diplomate of the American Board of Clinical Lipidology Attending Cardiologist  Direct Dial: [phone]  Fax: [phone]  Website:  [email or web address]

## 2021-12-16 ENCOUNTER — Encounter: Payer: Self-pay | Admitting: *Deleted

## 2021-12-16 DIAGNOSIS — Z006 Encounter for examination for normal comparison and control in clinical research program: Secondary | ICD-10-CM

## 2021-12-16 NOTE — Research (Signed)
Spoke with Randall Swanson informed him of his appointment tomorrow with research at 0930. Voices understanding.

## 2021-12-17 ENCOUNTER — Encounter: Payer: PRIVATE HEALTH INSURANCE | Admitting: *Deleted

## 2021-12-17 VITALS — BP 110/67 | HR 58 | Temp 98.0°F | Resp 16 | Wt 246.2 lb

## 2021-12-17 DIAGNOSIS — Z006 Encounter for examination for normal comparison and control in clinical research program: Secondary | ICD-10-CM

## 2021-12-17 MED ORDER — STUDY - ESSENCE - OLEZARSEN 50 MG, 80 MG OR PLACEBO SQ INJECTION (PI-HILTY)
50.0000 mg | INJECTION | Freq: Once | SUBCUTANEOUS | Status: DC
  Filled 2021-12-17: qty 0.5

## 2021-12-17 NOTE — Research (Signed)
   Ty Hilts Essence day 1 week 1   TREATMENT DAY 1 - STUDY WEEK 1   Subject Number:  S902         Randomization Number:  [id number]          Date: 17-Dec-2021   [x] Vital Signs Collected - Blood Pressure:110/67 - Weight:246.2 lbs - Heart Rate:58 - Respiratory Rate:16 - Temperature:98.0 - Oxygen Saturation:94%  [x]  Physical Exam Completed by PI or SUB-I  [x]  12-lead ECG  [x]  Extended Urinalysis   [x]  Lab collection per protocol  [x]  Assessment of ER Visits, Hospitalizations, and Inpatient Days  [x]  Adverse Events and Concomitant Medications  [x]  Diet, Lifestyle, and Alcohol Counseling   [x]  Study Drug: [place] Injection    Mr Marik in for Day 1 week 1 of ESSENCE research study. Reports no visits to the ED, urgent care, or his pcp.. No abd pain, or other pain.  No change in meds. Vs taken at 0936, Blood drawn at 0952, EKG at 0957. Injection given in left lower quad at 1030. Pt observed for 30 mins no problems noted. Dr did exam. Pt had no questions at this time.

## 2021-12-19 NOTE — Research (Addendum)
Francella Solian [facility] Day 1 Essence          ESSENCE Abnormal Lab report Month Day, Year   Chemistry: Hs-C Reaction Protein 8.4 mg/L     [] Clinically Significant  [x] Not Clinically Significant  Insulin 29.2       [] Clinically Significant  [x] Not Clinically Significant    Lipids:  Triglyceride   197 mg/dL                     [] Clinically Significant  [x] Not Clinically Significant   Any further action needed to be taken per the PI? No  , MD, [facility], FACP  [place]  [facility]  Medical Director of the Advanced Lipid Disorders &  Cardiovascular Risk Reduction Clinic Diplomate of the American Board of Clinical Lipidology Attending Cardiologist  Direct Dial: [phone]  Fax: [phone]  Website:  [email or web address]

## 2021-12-29 NOTE — Research (Signed)
Randall Swanson Week day 1 17-Dec-2021

## 2022-01-08 NOTE — Patient Instructions (Signed)
Injection was given in Left lower abdomen

## 2022-01-13 ENCOUNTER — Encounter: Payer: Self-pay | Admitting: *Deleted

## 2022-01-13 DIAGNOSIS — Z006 Encounter for examination for normal comparison and control in clinical research program: Secondary | ICD-10-CM

## 2022-01-13 NOTE — Research (Signed)
Spoke with Randall Swanson about his appointment tomorrow. States he needs to move it to Friday if possible. Appointment changed to Friday at 0930 (June 30)

## 2022-01-16 ENCOUNTER — Other Ambulatory Visit: Payer: Self-pay

## 2022-01-16 ENCOUNTER — Encounter: Payer: PRIVATE HEALTH INSURANCE | Admitting: *Deleted

## 2022-01-16 VITALS — BP 115/65 | HR 54 | Temp 97.9°F | Resp 16

## 2022-01-16 DIAGNOSIS — Z006 Encounter for examination for normal comparison and control in clinical research program: Secondary | ICD-10-CM

## 2022-01-16 MED ORDER — STUDY - ESSENCE - OLEZARSEN 50 MG, 80 MG OR PLACEBO SQ INJECTION (PI-HILTY)
50.0000 mg | INJECTION | Freq: Once | SUBCUTANEOUS | Status: AC
  Administered 2022-01-16: 50 mg via SUBCUTANEOUS
  Filled 2022-01-16: qty 0.5

## 2022-01-16 NOTE — Research (Addendum)
Randall Swanson  Essence Week 5 day 29 16-Jan-2022     TREATMENT DAY 29 - STUDY WEEK -5    Subject Number: [id number]            Randomization [id number]          Date: 16-January-2022      '[x]' Vital Signs Collected - Blood Pressure:115/65 - Heart Rate:54 - Respiratory Rate:16 - Temperature:97.9 - Oxygen Saturation:97%   '[x]'  Extended Urinalysis   '[x]'  Lab collection per protocol  '[x]'  Assessment of ER Visits, Hospitalizations, and Inpatient Days  '[x]'  Adverse Events and Concomitant Medications  '[x]'  Diet, Lifestyle, and Alcohol Counseling   '[x]'  Study Drug: [place] Injection    Mr Randall Swanson here for Week 5 Day 29 Essence research study. He reports no abd pain, or any other pain, no  visits to the ED, or urgent care, and no changes in his meds. Vs taken at 0920, Blood drawn at 0925, Urine obtained at 0930, and injection given into right lower abd at 1007. Kit number [id number]. Next appointment scheduled for July 27 at 0930.   Current Outpatient Medications:    albuterol (VENTOLIN HFA) 108 (90 Base) MCG/ACT inhaler, INHALE 2 PUFFS INTO THE LUNGS EVERY 6 HOURS AS NEEDED., Disp: 18 g, Rfl: 9   ALPRAZolam (XANAX) 0.5 MG tablet, TAKE 1/2 TABLET BY MOUTH EVERYDAY AS NEEDED FOR ANXIETY, Disp: 30 tablet, Rfl: 0   escitalopram (LEXAPRO) 20 MG tablet, Take 1 tablet (20 mg total) by mouth daily., Disp: 90 tablet, Rfl: 3   hydrochlorothiazide (HYDRODIURIL) 25 MG tablet, Take 1 tablet (25 mg total) by mouth daily., Disp: 90 tablet, Rfl: 3   hydrOXYzine (VISTARIL) 50 MG capsule, Take one cap PO HS PRN itching, Disp: 10 capsule, Rfl: 1   rosuvastatin (CRESTOR) 20 MG tablet, Take 1 tablet (20 mg total) by mouth daily., Disp: 90 tablet, Rfl: 2   Study - ESSENCE - olezarsen 50 mg, 80 mg or placebo SQ injection (PI-Hilty), Inject 50 mg into the skin every 28 (twenty-eight) days. For Investigational Use Only. Injection subcutaneously in protocol approved injection sites (abdomen, thigh or outer area of upper  arm) every 4 weeks in clinic. Please contact [facility] for any questions or concerns regarding this study medication, Disp: , Rfl:    valsartan (DIOVAN) 320 MG tablet, Take 1 tablet (320 mg total) by mouth daily., Disp: 90 tablet, Rfl: 3   methylPREDNISolone (MEDROL DOSEPAK) 4 MG TBPK tablet, Take 6 tablets by mouth on day 1, then 5 tablets on day 2, then 4 tablets on day 3, then 3 tablets on day 4, then 2 tablets on day 5, then 1 tablet on day 6. (Patient not taking: Reported on 11/04/2021), Disp: 21 tablet, Rfl: 0  Current Facility-Administered Medications:    Study - ESSENCE - olezarsen 50 mg, 80 mg or placebo SQ injection (PI-Hilty), 50 mg, Subcutaneous, Once, Hilty, Nadean Corwin, MD As noted no longer taking methylprednisolone

## 2022-01-19 NOTE — Research (Addendum)
Randall Swanson Essence week 5 day 29 16-January-2022          No clinically significant findings.  [name], MD, [facility], FACP  [place]  [facility]  Medical Director of the Advanced Lipid Disorders &  Cardiovascular Risk Reduction Clinic Diplomate of the American Board of Clinical Lipidology Attending Cardiologist  Direct Dial: [phone]  Fax: [phone]  Website:  [email or web address]

## 2022-01-21 NOTE — Research (Addendum)
Randall Swanson Essence Week 5 Day [phone]

## 2022-02-11 ENCOUNTER — Encounter: Payer: Self-pay | Admitting: *Deleted

## 2022-02-11 DIAGNOSIS — Z006 Encounter for examination for normal comparison and control in clinical research program: Secondary | ICD-10-CM

## 2022-02-11 NOTE — Research (Signed)
Message left for Randall Swanson to remind him of his appointment tomorrow at 0930 with research. Also reminded to be NPO and given the parking code.

## 2022-02-12 ENCOUNTER — Encounter: Payer: PRIVATE HEALTH INSURANCE | Admitting: *Deleted

## 2022-02-12 ENCOUNTER — Other Ambulatory Visit: Payer: Self-pay

## 2022-02-12 VITALS — BP 121/70 | HR 52 | Temp 97.9°F | Resp 18

## 2022-02-12 DIAGNOSIS — Z006 Encounter for examination for normal comparison and control in clinical research program: Secondary | ICD-10-CM

## 2022-02-12 MED ORDER — STUDY - ESSENCE - OLEZARSEN 50 MG, 80 MG OR PLACEBO SQ INJECTION (PI-HILTY)
50.0000 mg | INJECTION | Freq: Once | SUBCUTANEOUS | Status: AC
  Administered 2022-02-12: 50 mg via SUBCUTANEOUS
  Filled 2022-02-12: qty 0.5

## 2022-02-12 NOTE — Research (Addendum)
Randall Swanson Essence Week 9 day 57 12-Feb-2022.     TREATMENT DAY 57 - STUDY WEEK 9    Subject Number: [id number]         Randomization [id number]          Date:12-February-2022      '[x]' Vital Signs Collected - Blood Pressure: 121/70 - Heart Rate:52 - Respiratory Rate:18 - Temperature:97.9 - Oxygen Saturation:94%    '[x]'  Extended Urinalysis   '[x]'  Lab collection per protocol  '[x]'   (abdominal pain only) since last visit  '[x]'  Assessment of ER Visits, Hospitalizations, and Inpatient Days  '[x]'  Adverse Events and Concomitant Medications  '[x]'  Diet, Lifestyle, and Alcohol Counseling   '[x]'  Study Drug: [place] Injection    Randall Swanson here for Week 9 day 57 visit. He reports no ED or Urgent care visits, no changes in his medications, no Abd pain, or other pain. VS taken at 0918, Blood drawn at 0922, and Urine obtained at 0927. Injection was given in right lower abd at 0954. Kit number [id number]. Scheduled next appointment for Aug 263 at 0930. As noted patient no longer takes  Medrol dose pack  Current Outpatient Medications:    albuterol (VENTOLIN HFA) 108 (90 Base) MCG/ACT inhaler, INHALE 2 PUFFS INTO THE LUNGS EVERY 6 HOURS AS NEEDED., Disp: 18 g, Rfl: 9   escitalopram (LEXAPRO) 20 MG tablet, Take 1 tablet (20 mg total) by mouth daily., Disp: 90 tablet, Rfl: 3   hydrochlorothiazide (HYDRODIURIL) 25 MG tablet, Take 1 tablet (25 mg total) by mouth daily., Disp: 90 tablet, Rfl: 3   hydrOXYzine (VISTARIL) 50 MG capsule, Take one cap PO HS PRN itching, Disp: 10 capsule, Rfl: 1   methylPREDNISolone (MEDROL DOSEPAK) 4 MG TBPK tablet, Take 6 tablets by mouth on day 1, then 5 tablets on day 2, then 4 tablets on day 3, then 3 tablets on day 4, then 2 tablets on day 5, then 1 tablet on day 6. (Patient not taking: Reported on 11/04/2021), Disp: 21 tablet, Rfl: 0   rosuvastatin (CRESTOR) 20 MG tablet, Take 1 tablet (20 mg total) by mouth daily., Disp: 90 tablet, Rfl: 2   Study - ESSENCE - olezarsen 50 mg,  80 mg or placebo SQ injection (PI-Hilty), Inject 50 mg into the skin every 28 (twenty-eight) days. For Investigational Use Only. Injection subcutaneously in protocol approved injection sites (abdomen, thigh or outer area of upper arm) every 4 weeks in clinic. Please contact [facility] for any questions or concerns regarding this study medication, Disp: , Rfl:    valsartan (DIOVAN) 320 MG tablet, Take 1 tablet (320 mg total) by mouth daily., Disp: 90 tablet, Rfl: 3  Current Facility-Administered Medications:    Study - ESSENCE - olezarsen 50 mg, 80 mg or placebo SQ injection (PI-Hilty), 50 mg, Subcutaneous, Once, Hilty, Nadean Corwin, MD   Study - ESSENCE - olezarsen 50 mg, 80 mg or placebo SQ injection (PI-Hilty), 50 mg, Subcutaneous, Once, Hilty, Nadean Corwin, MD

## 2022-02-16 NOTE — Research (Addendum)
    LADANIAN KELTER Essence Week 9 Day 71 12-February-2022          Chemistry: Creatine Kinase (CK) 212  mmol/L    [] Clinically Significant  [x] Not Clinically Significant      Last CK was 199 on 01-16-22 CK 210 on 12-17-21 CK 300 on 11-04-21  Any further action needed to be taken per the PI?  No  CK appears stable.  01-[birth year]-1995, MD, [facility], FACP  [place]  [facility]  Medical Director of the Advanced Lipid Disorders &  Cardiovascular Risk Reduction Clinic Diplomate of the American Board of Clinical Lipidology Attending Cardiologist  Direct Dial: [phone]  Fax: [phone]  Website:  [email or web address]

## 2022-02-23 NOTE — Research (Signed)
Randall Swanson Essence Week 9 Day 70 12-February-2022

## 2022-03-10 ENCOUNTER — Encounter: Payer: Self-pay | Admitting: *Deleted

## 2022-03-10 DIAGNOSIS — Z006 Encounter for examination for normal comparison and control in clinical research program: Secondary | ICD-10-CM

## 2022-03-10 NOTE — Research (Signed)
Message left for Randall Swanson to remind him of his appointment with research. Also gave parking code and reminded to be NPO.

## 2022-03-11 ENCOUNTER — Other Ambulatory Visit: Payer: Self-pay

## 2022-03-11 ENCOUNTER — Encounter: Payer: PRIVATE HEALTH INSURANCE | Admitting: *Deleted

## 2022-03-11 VITALS — BP 124/77 | HR 58 | Temp 98.0°F | Resp 16

## 2022-03-11 DIAGNOSIS — Z006 Encounter for examination for normal comparison and control in clinical research program: Secondary | ICD-10-CM

## 2022-03-11 MED ORDER — STUDY - ESSENCE - OLEZARSEN 50 MG, 80 MG OR PLACEBO SQ INJECTION (PI-HILTY)
50.0000 mg | INJECTION | Freq: Once | SUBCUTANEOUS | Status: AC
  Administered 2022-03-11: 50 mg via SUBCUTANEOUS
  Filled 2022-03-11: qty 0.5

## 2022-03-11 NOTE — Research (Addendum)
TORY SEPTER Essence- Week 13 visit 11-Mar-2022        TREATMENT DAY 85- STUDY WEEK 13    Subject Number: [id number]            Randomization Number:[id number]          Date:11-Mar-2022     [x] Vital Signs Collected - Blood Pressure: 124/77 - Heart Rate:58 - Respiratory Rate:16 - Temperature:98.0 - Oxygen Saturation:95%   [x]  Extended Urinalysis   [x]  Lab collection per protocol  [x]  (abdominal pain only) since last visit  [x]  Assessment of ER Visits, Hospitalizations, and Inpatient Days  [x]  Adverse Events and Concomitant Medications  [x]  Diet, Lifestyle, and Alcohol Counseling   [x]  Study Drug: [place] Injection    Mr Gerrard here for Essence  Week 13 visit. He denies abd pain, or any other pain. Denies going to the ED or urgent care since last visit. Vs taken at 0916, Blood drawn at 0927, and Urine obtained at 0924. Injection was given in left lower abd at 1008.Kit number . Meds reviewed states he is not taking medrol dose pack and not taking Vistaril.    Current Outpatient Medications:    albuterol (VENTOLIN HFA) 108 (90 Base) MCG/ACT inhaler, INHALE 2 PUFFS INTO THE LUNGS EVERY 6 HOURS AS NEEDED., Disp: 18 g, Rfl: 9   escitalopram (LEXAPRO) 20 MG tablet, Take 1 tablet (20 mg total) by mouth daily., Disp: 90 tablet, Rfl: 3   hydrochlorothiazide (HYDRODIURIL) 25 MG tablet, Take 1 tablet (25 mg total) by mouth daily., Disp: 90 tablet, Rfl: 3   rosuvastatin (CRESTOR) 20 MG tablet, Take 1 tablet (20 mg total) by mouth daily., Disp: 90 tablet, Rfl: 2   Study - ESSENCE - olezarsen 50 mg, 80 mg or placebo SQ injection (PI-Hilty), Inject 50 mg into the skin every 28 (twenty-eight) days. For Investigational Use Only. Injection subcutaneously in protocol approved injection sites (abdomen, thigh or outer area of upper arm) every 4 weeks in clinic. Please contact [facility] for any questions or concerns regarding this study medication, Disp: , Rfl:     valsartan (DIOVAN) 320 MG tablet, Take 1 tablet (320 mg total) by mouth daily., Disp: 90 tablet, Rfl: 3   hydrOXYzine (VISTARIL) 50 MG capsule, Take one cap PO HS PRN itching (Patient not taking: Reported on 03/11/2022), Disp: 10 capsule, Rfl: 1   methylPREDNISolone (MEDROL DOSEPAK) 4 MG TBPK tablet, Take 6 tablets by mouth on day 1, then 5 tablets on day 2, then 4 tablets on day 3, then 3 tablets on day 4, then 2 tablets on day 5, then 1 tablet on day 6. (Patient not taking: Reported on 11/04/2021), Disp: 21 tablet, Rfl: 0  Current Facility-Administered Medications:    Study - ESSENCE - olezarsen 50 mg, 80 mg or placebo SQ injection (PI-Hilty), 50 mg, Subcutaneous, Once, Hilty, , MD

## 2022-03-13 NOTE — Research (Addendum)
Randall Swanson  [name] 13 day 29  11-Mar-2022           Chemistry: Creatine Kinase (CK) 229 U/L        [] Clinically Significant  [x] Not Clinically Significant    Hematology: White Blood cells 2.9                     [] Clinically Significant  [x] Not Clinically Significant   CK on 02-12-22 was 212  Any further action needed to be taken per the PI?  No, CK reasonably stable  , MD, [facility], FACP  [place]  [facility]  Medical Director of the Advanced Lipid Disorders &  Cardiovascular Risk Reduction Clinic Diplomate of the American Board of Clinical Lipidology Attending Cardiologist  Direct Dial: [phone]  Fax: [phone]  Website:  [email or web address]

## 2022-03-25 NOTE — Research (Addendum)
Randall Swanson  Essence Week 13 Day [id number] 11-Mar-2022  Kit number [phone]

## 2022-04-09 ENCOUNTER — Encounter: Payer: Self-pay | Admitting: *Deleted

## 2022-04-09 DIAGNOSIS — Z006 Encounter for examination for normal comparison and control in clinical research program: Secondary | ICD-10-CM

## 2022-04-09 NOTE — Research (Signed)
Spoke with Randall Swanson to remind him of his appointment Monday at 0900 voices understanding.

## 2022-04-13 ENCOUNTER — Other Ambulatory Visit: Payer: Self-pay

## 2022-04-13 ENCOUNTER — Encounter: Payer: PRIVATE HEALTH INSURANCE | Admitting: *Deleted

## 2022-04-13 DIAGNOSIS — Z006 Encounter for examination for normal comparison and control in clinical research program: Secondary | ICD-10-CM

## 2022-04-13 MED ORDER — STUDY - ESSENCE - OLEZARSEN 50 MG, 80 MG OR PLACEBO SQ INJECTION (PI-HILTY)
50.0000 mg | INJECTION | Freq: Once | SUBCUTANEOUS | Status: AC
  Administered 2022-04-13: 50 mg via SUBCUTANEOUS
  Filled 2022-04-13: qty 0.5

## 2022-04-13 NOTE — Research (Addendum)
Week 17 Essence [place] 25-Sept-2023  Mr. Leinen is here for week 1 of Essence research study. He reports no abd pain, and no ed or Urgent care visits since last seen.  Injection given in right Lower abd. Kit number [id number] Tol well. Next visit scheduled for Oct 24 at 0730. No changes in meds.   Current Outpatient Medications:    albuterol (VENTOLIN HFA) 108 (90 Base) MCG/ACT inhaler, INHALE 2 PUFFS INTO THE LUNGS EVERY 6 HOURS AS NEEDED., Disp: 18 g, Rfl: 9   escitalopram (LEXAPRO) 20 MG tablet, Take 1 tablet (20 mg total) by mouth daily., Disp: 90 tablet, Rfl: 3   hydrochlorothiazide (HYDRODIURIL) 25 MG tablet, Take 1 tablet (25 mg total) by mouth daily., Disp: 90 tablet, Rfl: 3   hydrOXYzine (VISTARIL) 50 MG capsule, Take one cap PO HS PRN itching (Patient not taking: Reported on 03/11/2022), Disp: 10 capsule, Rfl: 1   methylPREDNISolone (MEDROL DOSEPAK) 4 MG TBPK tablet, Take 6 tablets by mouth on day 1, then 5 tablets on day 2, then 4 tablets on day 3, then 3 tablets on day 4, then 2 tablets on day 5, then 1 tablet on day 6. (Patient not taking: Reported on 11/04/2021), Disp: 21 tablet, Rfl: 0   rosuvastatin (CRESTOR) 20 MG tablet, Take 1 tablet (20 mg total) by mouth daily., Disp: 90 tablet, Rfl: 2   Study - ESSENCE - olezarsen 50 mg, 80 mg or placebo SQ injection (PI-Hilty), Inject 50 mg into the skin every 28 (twenty-eight) days. For Investigational Use Only. Injection subcutaneously in protocol approved injection sites (abdomen, thigh or outer area of upper arm) every 4 weeks in clinic. Please contact [facility] for any questions or concerns regarding this study medication, Disp: , Rfl:    valsartan (DIOVAN) 320 MG tablet, Take 1 tablet (320 mg total) by mouth daily., Disp: 90 tablet, Rfl: 3  Current Facility-Administered Medications:    Study - ESSENCE - olezarsen 50 mg, 80 mg or placebo SQ injection (PI-Hilty), 50 mg, Subcutaneous, Once, Hilty, Nadean Corwin,  MD

## 2022-05-11 ENCOUNTER — Encounter: Payer: Self-pay | Admitting: *Deleted

## 2022-05-11 DIAGNOSIS — Z006 Encounter for examination for normal comparison and control in clinical research program: Secondary | ICD-10-CM

## 2022-05-11 MED ORDER — STUDY - ESSENCE - OLEZARSEN 50 MG, 80 MG OR PLACEBO SQ INJECTION (PI-HILTY)
50.0000 mg | INJECTION | SUBCUTANEOUS | Status: DC
  Administered 2022-05-12: 50 mg via SUBCUTANEOUS
  Filled 2022-05-11 (×2): qty 0.5

## 2022-05-11 NOTE — Research (Signed)
Message left to remind Randall Swanson of his appointment tomorrow at 0730. Gave parking code and reminded to be NPO.

## 2022-05-12 ENCOUNTER — Other Ambulatory Visit: Payer: Self-pay

## 2022-05-12 ENCOUNTER — Encounter: Payer: PRIVATE HEALTH INSURANCE | Admitting: *Deleted

## 2022-05-12 VITALS — BP 136/72 | HR 58 | Temp 97.3°F | Resp 16

## 2022-05-12 DIAGNOSIS — Z006 Encounter for examination for normal comparison and control in clinical research program: Secondary | ICD-10-CM

## 2022-05-12 NOTE — Research (Addendum)
     TREATMENT DAY 141 - STUDY WEEK 21    Subject Number: [id number]             Randomization Number: [id number]  Date: 12-May-2022   [x] Vital Signs Collected - Blood Pressure:136/72 - Heart Rate: 58 - Respiratory Rate:16 - Temperature:97.3 - Oxygen Saturation:95%   [x]   (abdominal pain only) since last visit)  [x]  Assessment of ER Visits, Hospitalizations, and Inpatient Days  [x]  Adverse Events and Concomitant Medications  [x]  Diet, Lifestyle, and Alcohol Counseling   [x]  Study Drug: [place] Injection    Randall Swanson  here for Week 231  Day 141 Essence visit. He reports no abd pain, no visit to the Ed or urgent care since last seen. No medication changes. VS taken at 0725. Injection given in lower left abd at 0800. Kit number [id number] well. Next visit scheduled for Nov 22 at 0900.    [name]     Subject Name: Randall Swanson  Subject met inclusion and exclusion criteria.  The informed consent form, study requirements and expectations were reviewed with the subject and questions and concerns were addressed prior to the signing of the consent form.  The subject verbalized understanding of the trial requirements.  The subject agreed to participate in the essence   trial and signed the informed consent at 0730 on 12-May-2022.  The informed consent was obtained prior to performance of any protocol-specific procedures for the subject.  A copy of the signed informed consent was given to the subject and a copy was placed in the subject's medical record.   [name]  Consent dated Sept 01-2022   Current Outpatient Medications:    albuterol (VENTOLIN HFA) 108 (90 Base) MCG/ACT inhaler, INHALE 2 PUFFS INTO THE LUNGS EVERY 6 HOURS AS NEEDED., Disp: 18 g, Rfl: 9   escitalopram (LEXAPRO) 20 MG tablet, Take 1 tablet (20 mg total) by mouth daily., Disp: 90 tablet, Rfl: 3   hydrochlorothiazide (HYDRODIURIL) 25 MG tablet, Take 1 tablet (25 mg total) by mouth daily., Disp: 90  tablet, Rfl: 3   rosuvastatin (CRESTOR) 20 MG tablet, Take 1 tablet (20 mg total) by mouth daily., Disp: 90 tablet, Rfl: 2   Study - ESSENCE - olezarsen 50 mg, 80 mg or placebo SQ injection (PI-Hilty), Inject 50 mg into the skin every 28 (twenty-eight) days. For Investigational Use Only. Injection subcutaneously in protocol approved injection sites (abdomen, thigh or outer area of upper arm) every 4 weeks in clinic. Please contact [facility] for any questions or concerns regarding this study medication, Disp: , Rfl:    valsartan (DIOVAN) 320 MG tablet, Take 1 tablet (320 mg total) by mouth daily., Disp: 90 tablet, Rfl: 3   hydrOXYzine (VISTARIL) 50 MG capsule, Take one cap PO HS PRN itching (Patient not taking: Reported on 03/11/2022), Disp: 10 capsule, Rfl: 1   methylPREDNISolone (MEDROL DOSEPAK) 4 MG TBPK tablet, Take 6 tablets by mouth on day 1, then 5 tablets on day 2, then 4 tablets on day 3, then 3 tablets on day 4, then 2 tablets on day 5, then 1 tablet on day 6. (Patient not taking: Reported on 11/04/2021), Disp: 21 tablet, Rfl: 0  Current Facility-Administered Medications:    Study - ESSENCE - olezarsen 50 mg, 80 mg or placebo SQ injection (PI-Hilty), 50 mg, Subcutaneous, Q28 days, Hilty, Nadean Corwin, MD

## 2022-06-09 ENCOUNTER — Encounter: Payer: Self-pay | Admitting: *Deleted

## 2022-06-09 DIAGNOSIS — Z006 Encounter for examination for normal comparison and control in clinical research program: Secondary | ICD-10-CM

## 2022-06-09 NOTE — Research (Signed)
Spoke with Randall Swanson to remind him of his appointment tomorrow at 0900. Reminded to be NPO, and gave parking code.

## 2022-06-10 ENCOUNTER — Other Ambulatory Visit: Payer: Self-pay

## 2022-06-10 ENCOUNTER — Encounter: Payer: PRIVATE HEALTH INSURANCE | Admitting: *Deleted

## 2022-06-10 DIAGNOSIS — Z006 Encounter for examination for normal comparison and control in clinical research program: Secondary | ICD-10-CM

## 2022-06-10 MED ORDER — STUDY - ESSENCE - OLEZARSEN 50 MG, 80 MG OR PLACEBO SQ INJECTION (PI-HILTY)
50.0000 mg | INJECTION | SUBCUTANEOUS | Status: DC
  Administered 2022-06-10 – 2022-07-09 (×2): 50 mg via SUBCUTANEOUS
  Filled 2022-06-10: qty 0.5

## 2022-06-10 NOTE — Progress Notes (Signed)
Patient seen today in follow up.  Seems to be tolerating IP well and has not had any changes to his knowledge.  He has not had much luck with regard to lifestyle change, and we again discussed this.  See my note of 11/04/2021.    Wt. 247 libs, T98.1, BP 107/56 P 57 R 16 02 sat 96% JVD none  Lungs minimal ronchii  Cor regular without murmur Abd soft Ext no edema   He will continue with IP.  Labs obtained today.   Arturo Morton. Riley Kill, MD, [facility] Medical Director, [facility]

## 2022-06-10 NOTE — Research (Addendum)
Randall Swanson 10-Jun-2022 Week 25 Day 169                       Chemistry: Creatinine Kinase (CK) 280 U/L          [] Clinically Significant  [x] Not Clinically Significant  Insulin 25.5                                          [] Clinically Significant  [x] Not Clinically Significant     Any further action needed to be taken per the PI?  NO  Pixie Casino, MD, [facility], [place] Director of the Advanced Lipid Disorders &  Cardiovascular Risk Reduction Clinic Diplomate of the American Board of Clinical Lipidology Attending Cardiologist  Direct Dial: [phone]  Fax: [phone]  Website:  [email or web address]     TREATMENT DAY 169 - STUDY WEEK 25    Subject Number: [id number]            Randomization [id number]          Date: 10-Jun-2022    [x] Vital Signs Collected - Blood Pressure:107/56 - Height: 6 ft 1 in - Weight:247.8 lbs - Heart Rate:57 - Respiratory Rate:16 - Temperature:98.1 - Oxygen Saturation:96%  [x]  Physical Exam Completed by PI or SUB-I  [x]  Extended Urinalysis   [x]  Lab collection per protocol  [x]   (abdominal pain only) since last visit  [x]  Assessment of ER Visits, Hospitalizations, and Inpatient Days  [x]  Adverse Events and Concomitant Medications  [x]  Diet, Lifestyle, and Alcohol Counseling   [x]  Study Drug: [place] Injection  Kit number [id number]  Randall Swanson is here for Week 25 Day 169 of Essence research. He reports no abd pain, no visits to the Ed or Urgent care since last seen. VS taken at 0900, blood work drawn at [facility], urine obtained at 0905. Injection given in right lower abd at [place] kit number [id number]. Next appointment scheduled for Dec 20 at 0900. No changes in medications noted. Dr Lia Foyer did exam today.    Current Outpatient Medications:    albuterol (VENTOLIN HFA) 108 (90 Base) MCG/ACT inhaler, INHALE 2 PUFFS INTO THE LUNGS EVERY 6 HOURS AS NEEDED., Disp: 18 g, Rfl: 9   escitalopram  (LEXAPRO) 20 MG tablet, Take 1 tablet (20 mg total) by mouth daily., Disp: 90 tablet, Rfl: 3   hydrochlorothiazide (HYDRODIURIL) 25 MG tablet, Take 1 tablet (25 mg total) by mouth daily., Disp: 90 tablet, Rfl: 3   rosuvastatin (CRESTOR) 20 MG tablet, Take 1 tablet (20 mg total) by mouth daily., Disp: 90 tablet, Rfl: 2   Study - ESSENCE - olezarsen 50 mg, 80 mg or placebo SQ injection (PI-Hilty), Inject 50 mg into the skin every 28 (twenty-eight) days. For Investigational Use Only. Injection subcutaneously in protocol approved injection sites (abdomen, thigh or outer area of upper arm) every 4 weeks in clinic. Please contact [facility] for any questions or concerns regarding this study medication, Disp: , Rfl:    valsartan (DIOVAN) 320 MG tablet, Take 1 tablet (320 mg total) by mouth daily., Disp: 90 tablet, Rfl: 3   hydrOXYzine (VISTARIL) 50 MG capsule, Take one cap PO HS PRN itching (Patient not taking: Reported on 03/11/2022), Disp: 10 capsule, Rfl: 1   methylPREDNISolone (MEDROL DOSEPAK) 4 MG TBPK tablet, Take 6 tablets by mouth on day 1, then 5 tablets  on day 2, then 4 tablets on day 3, then 3 tablets on day 4, then 2 tablets on day 5, then 1 tablet on day 6. (Patient not taking: Reported on 11/04/2021), Disp: 21 tablet, Rfl: 0  Current Facility-Administered Medications:    Study - ESSENCE - olezarsen 50 mg, 80 mg or placebo SQ injection (PI-Hilty), 50 mg, Subcutaneous, Q28 days, Hilty, Nadean Corwin, MD

## 2022-07-08 ENCOUNTER — Encounter: Payer: Self-pay | Admitting: *Deleted

## 2022-07-08 DIAGNOSIS — Z006 Encounter for examination for normal comparison and control in clinical research program: Secondary | ICD-10-CM

## 2022-07-08 NOTE — Research (Signed)
Message left to remind Randall Swanson of his appointment tomorrow at 0830 and  gave parking code.

## 2022-07-09 ENCOUNTER — Other Ambulatory Visit: Payer: Self-pay

## 2022-07-09 ENCOUNTER — Encounter: Payer: PRIVATE HEALTH INSURANCE | Admitting: *Deleted

## 2022-07-09 DIAGNOSIS — Z006 Encounter for examination for normal comparison and control in clinical research program: Secondary | ICD-10-CM

## 2022-07-09 MED ORDER — STUDY - ESSENCE - OLEZARSEN 50 MG, 80 MG OR PLACEBO SQ INJECTION (PI-HILTY)
50.0000 mg | INJECTION | SUBCUTANEOUS | Status: DC
  Filled 2022-07-09: qty 0.5

## 2022-07-09 NOTE — Research (Addendum)
     TREATMENT DAY 197 - STUDY WEEK 29    Subject Number: [id number]            Randomization Number:[id number]            Date:09-Jul-2022     [x] Vital Signs Collected - Blood Pressure:131/84 - Heart Rate:70 - Respiratory Rate:18 - Temperature:97.8 - Oxygen Saturation:98%   [x]   (abdominal pain only) since last visit  [x]  Assessment of ER Visits, Hospitalizations, and Inpatient Days  [x]  Adverse Events and Concomitant Medications  [x]  Diet, Lifestyle, and Alcohol Counseling   [x]  Study Drug: [place] Injection   Randall Swanson is here for Week 29 day 197 of Essence. He reports no abd pain, no visits to the Ed or urgent care. No changes in his medications. Vs Taken at 0840. Injection given at 0912 in left lower abd. Kit number [id number] Tol well. Next visit scheduled for Jan 17 at 0900.    Current Outpatient Medications:    albuterol (VENTOLIN HFA) 108 (90 Base) MCG/ACT inhaler, INHALE 2 PUFFS INTO THE LUNGS EVERY 6 HOURS AS NEEDED., Disp: 18 g, Rfl: 9   escitalopram (LEXAPRO) 20 MG tablet, Take 1 tablet (20 mg total) by mouth daily., Disp: 90 tablet, Rfl: 3   hydrochlorothiazide (HYDRODIURIL) 25 MG tablet, Take 1 tablet (25 mg total) by mouth daily., Disp: 90 tablet, Rfl: 3   rosuvastatin (CRESTOR) 20 MG tablet, Take 1 tablet (20 mg total) by mouth daily., Disp: 90 tablet, Rfl: 2   Study - ESSENCE - olezarsen 50 mg, 80 mg or placebo SQ injection (PI-Hilty), Inject 50 mg into the skin every 28 (twenty-eight) days. For Investigational Use Only. Injection subcutaneously in protocol approved injection sites (abdomen, thigh or outer area of upper arm) every 4 weeks in clinic. Please contact [place] for any questions or concerns regarding this study medication, Disp: , Rfl:    valsartan (DIOVAN) 320 MG tablet, Take 1 tablet (320 mg total) by mouth daily., Disp: 90 tablet, Rfl: 3   hydrOXYzine (VISTARIL) 50 MG capsule, Take one cap PO HS PRN itching (Patient not taking:  Reported on 03/11/2022), Disp: 10 capsule, Rfl: 1   methylPREDNISolone (MEDROL DOSEPAK) 4 MG TBPK tablet, Take 6 tablets by mouth on day 1, then 5 tablets on day 2, then 4 tablets on day 3, then 3 tablets on day 4, then 2 tablets on day 5, then 1 tablet on day 6. (Patient not taking: Reported on 11/04/2021), Disp: 21 tablet, Rfl: 0  Current Facility-Administered Medications:    Study - ESSENCE - olezarsen 50 mg, 80 mg or placebo SQ injection (PI-Hilty), 50 mg, Subcutaneous, Q28 days, Hilty, Nadean Corwin, MD

## 2022-07-23 NOTE — Research (Addendum)
Abbie Sons Essence Week 1 Day 1  17-Dec-2021    Apolipoprotein B48  1.55    mg/dL               [] Clinically Significant  [x] Not Clinically Significant   Any further action needed to be taken per the PI?  No  Pixie Casino, MD, [facility], [place] Director of the Advanced Lipid Disorders &  Cardiovascular Risk Reduction Clinic Diplomate of the American Board of Clinical Lipidology Attending Cardiologist  Direct Dial: [phone]  Fax: [phone]  Website:  [email or web address]

## 2022-07-24 NOTE — Research (Cosign Needed)
[place] Screening run in 04-Nov-2021    Apolipoprotein  B48  1.33 mg/dL                 [] Clinically Significant  [x] Not Clinically Significant     Any further action needed to be taken per the PI?  No  Pixie Casino, MD, [facility], [place] Director of the Advanced Lipid Disorders &  Cardiovascular Risk Reduction Clinic Diplomate of the American Board of Clinical Lipidology Attending Cardiologist  Direct Dial: [phone]  Fax: [phone]  Website:  [email or web address]

## 2022-08-04 ENCOUNTER — Encounter: Payer: Self-pay | Admitting: *Deleted

## 2022-08-04 DIAGNOSIS — Z006 Encounter for examination for normal comparison and control in clinical research program: Secondary | ICD-10-CM

## 2022-08-04 NOTE — Research (Deleted)
Message left to remind Randall Swanson of his appointment tomorrow and gave the parking code.

## 2022-08-04 NOTE — Research (Signed)
Message left to remind Randall Swanson of his appointment tomorrow, also gave the parking code.

## 2022-08-05 ENCOUNTER — Encounter: Payer: PRIVATE HEALTH INSURANCE | Admitting: *Deleted

## 2022-08-05 ENCOUNTER — Other Ambulatory Visit: Payer: Self-pay

## 2022-08-05 DIAGNOSIS — Z006 Encounter for examination for normal comparison and control in clinical research program: Secondary | ICD-10-CM

## 2022-08-05 MED ORDER — STUDY - ESSENCE - OLEZARSEN 50 MG, 80 MG OR PLACEBO SQ INJECTION (PI-HILTY)
50.0000 mg | INJECTION | SUBCUTANEOUS | Status: DC
  Administered 2022-08-05: 50 mg via SUBCUTANEOUS
  Filled 2022-08-05: qty 0.5

## 2022-08-05 NOTE — Research (Addendum)
     TREATMENT DAY 225 - STUDY WEEK 33    Subject Number: [id number]            Randomization Number: [id number]            Date: 05-Aug-2022      [x] Vital Signs Collected - Blood Pressure:131/69 - Heart Rate:79 - Respiratory Rate:18 - Temperature:98.2 - Oxygen Saturation: 97%      [x]  (abdominal pain only) since last visit  [x]  Assessment of ER Visits, Hospitalizations, and Inpatient Days  [x]  Adverse Events and Concomitant Medications  [x]  Diet, Lifestyle, and Alcohol Counseling   [x]  Study Drug: [place] Injection   Randall Swanson is here for Week 33 Day 225 of the Essence research study. He reports no abd pain and no visits to the ED or Urgent care since last seen. No changes in his medications. VS taken at 0855. Injection given at 0925 in right lower abd.  Kit number [id number] Tol well. Scheduled next visit for Feb 13 at 0800.   Current Outpatient Medications:    albuterol (VENTOLIN HFA) 108 (90 Base) MCG/ACT inhaler, INHALE 2 PUFFS INTO THE LUNGS EVERY 6 HOURS AS NEEDED., Disp: 18 g, Rfl: 9   escitalopram (LEXAPRO) 20 MG tablet, Take 1 tablet (20 mg total) by mouth daily., Disp: 90 tablet, Rfl: 3   hydrochlorothiazide (HYDRODIURIL) 25 MG tablet, Take 1 tablet (25 mg total) by mouth daily., Disp: 90 tablet, Rfl: 3   rosuvastatin (CRESTOR) 20 MG tablet, Take 1 tablet (20 mg total) by mouth daily., Disp: 90 tablet, Rfl: 2   Study - ESSENCE - olezarsen 50 mg, 80 mg or placebo SQ injection (PI-Hilty), Inject 50 mg into the skin every 28 (twenty-eight) days. For Investigational Use Only. Injection subcutaneously in protocol approved injection sites (abdomen, thigh or outer area of upper arm) every 4 weeks in clinic. Please contact [facility] for any questions or concerns regarding this study medication, Disp: , Rfl:    valsartan (DIOVAN) 320 MG tablet, Take 1 tablet (320 mg total) by mouth daily., Disp: 90 tablet, Rfl: 3   hydrOXYzine (VISTARIL) 50 MG capsule, Take one  cap PO HS PRN itching (Patient not taking: Reported on 03/11/2022), Disp: 10 capsule, Rfl: 1   methylPREDNISolone (MEDROL DOSEPAK) 4 MG TBPK tablet, Take 6 tablets by mouth on day 1, then 5 tablets on day 2, then 4 tablets on day 3, then 3 tablets on day 4, then 2 tablets on day 5, then 1 tablet on day 6. (Patient not taking: Reported on 11/04/2021), Disp: 21 tablet, Rfl: 0  Current Facility-Administered Medications:    Study - ESSENCE - olezarsen 50 mg, 80 mg or placebo SQ injection (PI-Hilty), 50 mg, Subcutaneous, Q28 days, Hilty, Nadean Corwin, MD, 50 mg at 08/05/22 [phone]

## 2022-08-07 ENCOUNTER — Other Ambulatory Visit: Payer: Self-pay | Admitting: Family Medicine

## 2022-08-07 DIAGNOSIS — F411 Generalized anxiety disorder: Secondary | ICD-10-CM

## 2022-08-31 ENCOUNTER — Telehealth: Payer: Self-pay | Admitting: *Deleted

## 2022-08-31 ENCOUNTER — Encounter: Payer: Self-pay | Admitting: *Deleted

## 2022-08-31 DIAGNOSIS — Z006 Encounter for examination for normal comparison and control in clinical research program: Secondary | ICD-10-CM

## 2022-08-31 NOTE — Research (Signed)
Spoke with Randall Swanson to remind him of his appointment tomorrow at 0800. Gave parking code, and reminded to be NPO. Voices understanding.

## 2022-09-01 ENCOUNTER — Encounter: Payer: PRIVATE HEALTH INSURANCE | Admitting: *Deleted

## 2022-09-01 ENCOUNTER — Other Ambulatory Visit: Payer: Self-pay

## 2022-09-01 DIAGNOSIS — Z006 Encounter for examination for normal comparison and control in clinical research program: Secondary | ICD-10-CM

## 2022-09-01 MED ORDER — STUDY - ESSENCE - OLEZARSEN 50 MG, 80 MG OR PLACEBO SQ INJECTION (PI-HILTY)
50.0000 mg | INJECTION | SUBCUTANEOUS | Status: DC
  Administered 2022-09-01: 50 mg via SUBCUTANEOUS
  Filled 2022-09-01: qty 0.5

## 2022-09-01 MED ORDER — STUDY - ESSENCE - OLEZARSEN 50 MG, 80 MG OR PLACEBO SQ INJECTION (PI-HILTY): 50.0000 mg | INJECTION | SUBCUTANEOUS | Status: DC

## 2022-09-01 NOTE — Research (Addendum)
[place] [place] [facility]ence Week 37 Day 253 01-Sep-2022                    Hematology: Lymphocyte % 11.9                        [] Clinically Significant  [x] Not Clinically Significant Lymphocyte (Absolute) 0.86           [] Clinically Significant  [x] Not Clinically Significant     Any further action needed to be taken per the PI? No  Pixie Casino, MD, [facility], [place] Director of the Advanced Lipid Disorders &  Cardiovascular Risk Reduction Clinic Diplomate of the American Board of Clinical Lipidology Attending Cardiologist  Direct Dial: [phone]  Fax: [phone]  Website:  [email or web address]     TREATMENT DAY 253 - STUDY WEEK 37    Subject Number: [id number]              Randomization Number: [id number]      Date: 01-Sep-2022     [x] Vital Signs Collected - Blood Pressure:122/74 - Heart Rate:66 - Respiratory Rate:20 - Temperature:98.1 - Oxygen Saturation:98%   [x]  Extended Urinalysis   [x]  Lab collection per protocol  [x]   (abdominal pain only) since last visit  [x]  Assessment of ER Visits, Hospitalizations, and Inpatient Days  [x]  Adverse Events and Concomitant Medications  [x]  Diet, Lifestyle, and Alcohol Counseling   [x]  Study Drug: [place] Injection   Mr Soun is here for Week 57 Day 253 of Essence research study. He reports no abd pain, no visits to the ED or Urgent care, and no changes in his medications. VS taken  at 0800, blood drawn at 0809, and urine obtained at 0803. Injection was given at 0823 in right lower abd . Tol well. Kit number [id number]. Scheduled next visit for March 13 at 0900   Current Outpatient Medications:    albuterol (VENTOLIN HFA) 108 (90 Base) MCG/ACT inhaler, INHALE 2 PUFFS INTO THE LUNGS EVERY 6 HOURS AS NEEDED., Disp: 18 g, Rfl: 9   escitalopram (LEXAPRO) 20 MG tablet, Take 1 tablet (20 mg total) by mouth daily., Disp: 90 tablet, Rfl: 3   hydrochlorothiazide (HYDRODIURIL) 25 MG tablet,  Take 1 tablet (25 mg total) by mouth daily., Disp: 90 tablet, Rfl: 3   rosuvastatin (CRESTOR) 20 MG tablet, Take 1 tablet (20 mg total) by mouth daily., Disp: 90 tablet, Rfl: 2   Study - ESSENCE - olezarsen 50 mg, 80 mg or placebo SQ injection (PI-Hilty), Inject 50 mg into the skin every 28 (twenty-eight) days. For Investigational Use Only. Injection subcutaneously in protocol approved injection sites (abdomen, thigh or outer area of upper arm) every 4 weeks in clinic. Please contact [facility] for any questions or concerns regarding this study medication, Disp: , Rfl:    valsartan (DIOVAN) 320 MG tablet, Take 1 tablet (320 mg total) by mouth daily., Disp: 90 tablet, Rfl: 3   hydrOXYzine (VISTARIL) 50 MG capsule, Take one cap PO HS PRN itching (Patient not taking: Reported on 03/11/2022), Disp: 10 capsule, Rfl: 1   methylPREDNISolone (MEDROL DOSEPAK) 4 MG TBPK tablet, Take 6 tablets by mouth on day 1, then 5 tablets on day 2, then 4 tablets on day 3, then 3 tablets on day 4, then 2 tablets on day 5, then 1 tablet on day 6. (Patient not taking: Reported on 11/04/2021), Disp: 21 tablet, Rfl: 0  Current Facility-Administered Medications:  Study - ESSENCE - olezarsen 50 mg, 80 mg or placebo SQ injection (PI-Hilty), 50 mg, Subcutaneous, Q28 days, Hilty, Nadean Corwin, MD, 50 mg at 09/01/22 [phone]

## 2022-09-30 ENCOUNTER — Encounter: Payer: PRIVATE HEALTH INSURANCE | Admitting: *Deleted

## 2022-09-30 DIAGNOSIS — Z006 Encounter for examination for normal comparison and control in clinical research program: Secondary | ICD-10-CM

## 2022-09-30 MED ORDER — STUDY - ESSENCE - OLEZARSEN 50 MG, 80 MG OR PLACEBO SQ INJECTION (PI-HILTY)
50.0000 mg | INJECTION | SUBCUTANEOUS | Status: DC
  Filled 2022-09-30: qty 0.5

## 2022-09-30 NOTE — Research (Signed)
Essence   TREATMENT DAY 281 - STUDY WEEK 41       Subject Number: [id number]              Randomization Number: [id number]      Date: 13 Mar-2024         '[x]'$ Vital Signs Collected - Blood Pressure:121/75 - Heart Rate:64 - Respiratory Rate:18 - Temperature:97.8 - Oxygen Saturation:96%     '[]'$  Extended Urinalysis    '[]'$  Lab collection per protocol   '[x]'$   (abdominal pain only) since last visit   '[x]'$  Assessment of ER Visits, Hospitalizations, and Inpatient Days   '[x]'$  Adverse Events and Concomitant Medications   '[x]'$  Diet, Lifestyle, and Alcohol Counseling    '[x]'$  Study Drug: [place] Injection    Mr Wille is here for Week 67 Day 281 of Essence research study. He reports no abd pain, no visits to the ED or Urgent care, and no changes in his medications. VS taken  at 0856. Injection was given at 09:26 in left upper abd . Tol well. Kit number [id number]. Scheduled next visit for April 5th at 0900.

## 2022-10-06 NOTE — Patient Instructions (Incomplete)
It was great to see you again today, I will be in touch with your lab results as soon as possible Recommend COVID booster if not done in the last 6 months or so Recommend smoking cessation! Please stop by labs and then imaging on the ground floor  If anything changes with this lightheaded episodes please let me know

## 2022-10-06 NOTE — Progress Notes (Addendum)
[facility] [address], [address] [place], [place] [address] [phone]  Date:  10/08/2022   Name:  Randall Swanson   DOB:  May 14, 1976   MRN:  [id number]  PCP:  Pearline Cables, MD    Chief Complaint: Annual Exam (Concerns/ questions: 1. 4 spells of dizziness in the past month 2. Back pain, would like a referral /Flu shot today: none/Colon: onc, rpt at age 47)   History of Present Illness:  Randall Swanson is a 47 y.o. very pleasant male patient who presents with the following:  Patient seen today for physical exam- history of hypertension, COPD, dyslipidemia, GERD, OSA, obesity  Most recent visit with myself January 2023.  Married, he works as a Investment banker, operational.  They have 3 daughters- 8, 67 and 24 He is a smoker-pack to 1 pack/day  He is participating in a research study through cardiology- they are giving him a new drug for triglycerides, he is not sure if he is on treatment or placebo  Colon cancer screening- pt notes he did have a colonoscopy he thinks 6- 7 years ago but he is not sure.  I am not able to find this record.  He would like to be referred to GI for repeat colonoscopy at this time Flu shot COVID booster Labs on chart from 1 year ago, can be updated  Albuterol as needed-  Lexapro HCTZ 25 Diovan 320 Crestor  Over the last 4-5 weeks he has noted himself feeling weak,  lightheaded as though he might pass out. This will come and go, may last 4-5 minutes, seems to go away on its own- he may even feel clammy when it occurs However he does not notice an urge to eat or drink as he would often see with hypoglycemia No CP or SOB He will try to sit down during the episode and this helps  Has occurred maybe once a week for the last month Last occurred maybe 10-14 days ago No pattern that he can determine He may get SOB with exercise but no CP He is not sure if any change from his baseline exercise tolerance  Also he has  noted back pain for a few years Worse when he is standing up, better with sitting More pain when he is bending and lifting The left side of his back will go numb He had plain x-rays in 2018 which were negative Never had an MRI, injection, surgery, etc  Patient Active Problem List   Diagnosis Date Noted   Pre-diabetes 05/08/2020   Lumbar degenerative disc disease 01/25/2017   Carpal tunnel syndrome, right 01/25/2017   Dyslipidemia 11/27/2016   GAD (generalized anxiety disorder) 11/26/2016   COPD, 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  Vaping Use   Vaping Use: Never used  Substance Use Topics   Alcohol use:  Yes   Drug use: No    Family History  Problem Relation Age of Onset   Hypertension Mother    Hypertension Father     No Known Allergies  Medication list has been reviewed and updated.  Current Outpatient Medications on File Prior to Visit  Medication Sig Dispense Refill   hydrOXYzine (VISTARIL) 50 MG capsule Take one cap PO HS PRN itching 10 capsule 1   Study - ESSENCE - olezarsen 50 mg, 80 mg or placebo SQ injection (PI-Hilty) Inject 50 mg into the skin every 28 (twenty-eight) days. For Investigational Use Only. Injection subcutaneously in protocol approved injection sites (abdomen, thigh or outer area of upper arm) every 4 weeks in clinic. Please contact [facility] for any questions or concerns regarding this study medication      Current Facility-Administered Medications on File Prior to Visit  Medication Dose Route Frequency Provider Last Rate Last Admin   Study - ESSENCE - olezarsen 50 mg, 80 mg or placebo SQ injection (PI-Hilty)  50 mg Subcutaneous Q28 days Hilty, Lisette Abu, MD        Review of Systems:  As per HPI- otherwise negative.   Physical Examination: Vitals:   10/08/22 1036  BP: 120/72  Pulse: 78  Resp: 18  Temp: 97.9 F (36.6 C)  SpO2: 95%   Vitals:   10/08/22 1036  Weight: 257 lb (116.6 kg)  Height: 6\' 1"  (1.854 m)   Body mass index is 33.91 kg/m. Ideal Body Weight: Weight in (lb) to have BMI = 25: 189.1  GEN: no acute distress.  Obese, otherwise looks well HEENT: Atraumatic, Normocephalic.  Bilateral TM wnl, oropharynx normal.  PEERL,EOMI.   Ears and Nose: No external deformity. CV: RRR, No M/G/R. No JVD. No thrill. No extra heart sounds. PULM: CTA B, no wheezes, crackles, rhonchi. No retractions. No resp. distress. No accessory muscle use. ABD: S, NT, ND. No rebound. No HSM. EXTR: No c/c/e PSYCH: Normally interactive. Conversant.  Back- pt notes pain and numbness will run from the mid lumbar line to the left side  , Lumbar flexion is mildly decreased, extension normal.  Negative bilateral lower extremity strength, sensation, DTR.  Negative straight leg raise bilaterally EKG- SR Compared with tracing from 2016 RSR' V1 is new, ow stable   Assessment and Plan: Physical exam  Essential hypertension - Plan: CBC, Comprehensive metabolic panel, hydrochlorothiazide (HYDRODIURIL) 25 MG tablet, valsartan (DIOVAN) 320 MG tablet  COPD, mild (HCC) - Plan: albuterol (VENTOLIN HFA) 108 (90 Base) MCG/ACT inhaler  Pre-diabetes - Plan: Hemoglobin A1c  Screening for hyperlipidemia - Plan: Lipid panel  Screening for prostate cancer - Plan: PSA  GAD (generalized anxiety disorder) - Plan: escitalopram (LEXAPRO) 20 MG tablet  OSA (obstructive sleep apnea)  Dyslipidemia - Plan:  rosuvastatin (CRESTOR) 20 MG tablet  Lightheaded - Plan: EKG 12-Lead, TSH, CT CARDIAC SCORING (SELF PAY ONLY)  Chronic left-sided low back pain with left-sided sciatica - Plan: DG Lumbar Spine Complete  Screening for colon cancer - Plan: Ambulatory referral to Gastroenterology   Physical exam today and a couple of other concerns.  Encouraged healthy diet and exercise routine Whenever health maintenance, labs are pending.  Referral to GI for screening colonoscopy He is having good results with Lexapro, refilled  Episodes of lightheadedness; symptoms are nonspecific.  Await lab work as above.  We also plan to obtain a CT coronary calcium to help her stratify for heart disease.  Depending on results may wish to get a perfusion  study I have asked him to me posted about any further episodes of lightheadedness  He notes back pain suspicious for degenerative disc disease or possibly spinal stenosis.  Will start with plain x-rays and can proceed to MRI as needed Signed Abbe Amsterdam, MD  Received labs as below, letter to pt   Results for orders placed or performed in visit on 10/08/22  CBC  Result Value Ref Range   WBC 7.1 4.0 - 10.5 K/uL   RBC 4.82 4.22 - 5.81 Mil/uL   Platelets 245.0 150.0 - 400.0 K/uL   Hemoglobin 14.4 13.0 - 17.0 g/dL   HCT 16.1 09.6 - 04.5 %   MCV 87.2 78.0 - 100.0 fl   MCHC 34.3 30.0 - 36.0 g/dL   RDW 40.9 81.1 - 91.4 %  Comprehensive metabolic panel  Result Value Ref Range   Sodium 138 135 - 145 mEq/L   Potassium 3.9 3.5 - 5.1 mEq/L   Chloride 102 96 - 112 mEq/L   CO2 30 19 - 32 mEq/L   Glucose, Bld 96 70 - 99 mg/dL   BUN 12 6 - 23 mg/dL   Creatinine, Ser 7.82 0.40 - 1.50 mg/dL   Total Bilirubin 0.4 0.2 - 1.2 mg/dL   Alkaline Phosphatase 53 39 - 117 U/L   AST 24 0 - 37 U/L   ALT 31 0 - 53 U/L   Total Protein 6.5 6.0 - 8.3 g/dL   Albumin 4.4 3.5 - 5.2 g/dL   GFR 956.21 >30.86 mL/min   Calcium 9.4 8.4 - 10.5 mg/dL  Hemoglobin V7Q  Result Value Ref  Range   Hgb A1c MFr Bld 6.1 4.6 - 6.5 %  Lipid panel  Result Value Ref Range   Cholesterol 115 0 - 200 mg/dL   Triglycerides 469.6 (H) 0.0 - 149.0 mg/dL   HDL 29.52 (L) >84.13 mg/dL   VLDL 24.4 0.0 - 01.0 mg/dL   LDL Cholesterol 43 0 - 99 mg/dL   Total CHOL/HDL Ratio 3    NonHDL 82.07   PSA  Result Value Ref Range   PSA 0.60 0.10 - 4.00 ng/mL  TSH  Result Value Ref Range   TSH 0.70 0.35 - 5.50 uIU/mL

## 2022-10-08 ENCOUNTER — Ambulatory Visit (INDEPENDENT_AMBULATORY_CARE_PROVIDER_SITE_OTHER): Payer: PRIVATE HEALTH INSURANCE | Admitting: Family Medicine

## 2022-10-08 ENCOUNTER — Ambulatory Visit (HOSPITAL_BASED_OUTPATIENT_CLINIC_OR_DEPARTMENT_OTHER)
Admission: RE | Admit: 2022-10-08 | Discharge: 2022-10-08 | Disposition: A | Discharge status: Home / Self Care | Payer: PRIVATE HEALTH INSURANCE | Source: Ambulatory Visit | Attending: Family Medicine | Admitting: Family Medicine

## 2022-10-08 VITALS — BP 120/72 | HR 78 | Temp 97.9°F | Resp 18 | Ht 73.0 in | Wt 257.0 lb

## 2022-10-08 DIAGNOSIS — J449 Chronic obstructive pulmonary disease, unspecified: Secondary | ICD-10-CM | POA: Diagnosis not present

## 2022-10-08 DIAGNOSIS — I10 Essential (primary) hypertension: Secondary | ICD-10-CM

## 2022-10-08 DIAGNOSIS — Z125 Encounter for screening for malignant neoplasm of prostate: Secondary | ICD-10-CM

## 2022-10-08 DIAGNOSIS — M5442 Lumbago with sciatica, left side: Secondary | ICD-10-CM

## 2022-10-08 DIAGNOSIS — E785 Hyperlipidemia, unspecified: Secondary | ICD-10-CM

## 2022-10-08 DIAGNOSIS — Z13220 Encounter for screening for lipoid disorders: Secondary | ICD-10-CM | POA: Diagnosis not present

## 2022-10-08 DIAGNOSIS — Z0000 Encounter for general adult medical examination without abnormal findings: Secondary | ICD-10-CM

## 2022-10-08 DIAGNOSIS — Z1211 Encounter for screening for malignant neoplasm of colon: Secondary | ICD-10-CM

## 2022-10-08 DIAGNOSIS — R7303 Prediabetes: Secondary | ICD-10-CM

## 2022-10-08 DIAGNOSIS — R42 Dizziness and giddiness: Secondary | ICD-10-CM

## 2022-10-08 DIAGNOSIS — G8929 Other chronic pain: Secondary | ICD-10-CM

## 2022-10-08 DIAGNOSIS — F411 Generalized anxiety disorder: Secondary | ICD-10-CM

## 2022-10-08 DIAGNOSIS — G4733 Obstructive sleep apnea (adult) (pediatric): Secondary | ICD-10-CM

## 2022-10-08 LAB — CBC
HCT: 42 % (ref 39.0–52.0)
Hemoglobin: 14.4 g/dL (ref 13.0–17.0)
MCHC: 34.3 g/dL (ref 30.0–36.0)
MCV: 87.2 fl (ref 78.0–100.0)
Platelets: 245 10*3/uL (ref 150.0–400.0)
RBC: 4.82 Mil/uL (ref 4.22–5.81)
RDW: 13.4 % (ref 11.5–15.5)
WBC: 7.1 10*3/uL (ref 4.0–10.5)

## 2022-10-08 LAB — COMPREHENSIVE METABOLIC PANEL
ALT: 31 U/L (ref 0–53)
AST: 24 U/L (ref 0–37)
Albumin: 4.4 g/dL (ref 3.5–5.2)
Alkaline Phosphatase: 53 U/L (ref 39–117)
BUN: 12 mg/dL (ref 6–23)
CO2: 30 mEq/L (ref 19–32)
Calcium: 9.4 mg/dL (ref 8.4–10.5)
Chloride: 102 mEq/L (ref 96–112)
Creatinine, Ser: 0.72 mg/dL (ref 0.40–1.50)
GFR: 108.23 mL/min (ref >60.00)
Glucose, Bld: 96 mg/dL (ref 70–99)
Potassium: 3.9 mEq/L (ref 3.5–5.1)
Sodium: 138 mEq/L (ref 135–145)
Total Bilirubin: 0.4 mg/dL (ref 0.2–1.2)
Total Protein: 6.5 g/dL (ref 6.0–8.3)

## 2022-10-08 LAB — LIPID PANEL
Cholesterol: 115 mg/dL (ref 0–200)
HDL: 33 mg/dL — ABNORMAL LOW (ref >39.00)
LDL Cholesterol: 43 mg/dL (ref 0–99)
NonHDL: 82.07
Total CHOL/HDL Ratio: 3
Triglycerides: 197 mg/dL — ABNORMAL HIGH (ref 0.0–149.0)
VLDL: 39.4 mg/dL (ref 0.0–40.0)

## 2022-10-08 LAB — HEMOGLOBIN A1C: Hgb A1c MFr Bld: 6.1 % (ref 4.6–6.5)

## 2022-10-08 LAB — PSA: PSA: 0.6 ng/mL (ref 0.10–4.00)

## 2022-10-08 LAB — TSH: TSH: 0.7 u[IU]/mL (ref 0.35–5.50)

## 2022-10-08 MED ORDER — HYDROCHLOROTHIAZIDE 25 MG PO TABS: 25.0000 mg | ORAL_TABLET | Freq: Every day | ORAL | 3 refills | Status: AC

## 2022-10-08 MED ORDER — ALBUTEROL SULFATE HFA 108 (90 BASE) MCG/ACT IN AERS: INHALATION_SPRAY | RESPIRATORY_TRACT | 9 refills | Status: AC

## 2022-10-08 MED ORDER — ESCITALOPRAM OXALATE 20 MG PO TABS: 20.0000 mg | ORAL_TABLET | Freq: Every day | ORAL | 3 refills | Status: AC

## 2022-10-08 MED ORDER — VALSARTAN 320 MG PO TABS: 320.0000 mg | ORAL_TABLET | Freq: Every day | ORAL | 3 refills | Status: AC

## 2022-10-08 MED ORDER — ROSUVASTATIN CALCIUM 20 MG PO TABS: 20.0000 mg | ORAL_TABLET | Freq: Every day | ORAL | 2 refills | Status: DC

## 2022-10-12 ENCOUNTER — Other Ambulatory Visit (HOSPITAL_BASED_OUTPATIENT_CLINIC_OR_DEPARTMENT_OTHER): Payer: PRIVATE HEALTH INSURANCE

## 2022-10-13 ENCOUNTER — Encounter: Payer: Self-pay | Admitting: Family Medicine

## 2022-10-22 ENCOUNTER — Encounter: Payer: Self-pay | Admitting: *Deleted

## 2022-10-22 DIAGNOSIS — Z006 Encounter for examination for normal comparison and control in clinical research program: Secondary | ICD-10-CM

## 2022-10-22 NOTE — Research (Signed)
Message left for Randall Swanson to remind him of his appointment tomorrow at 0900, gave him the parking code.

## 2022-10-23 ENCOUNTER — Encounter: Payer: PRIVATE HEALTH INSURANCE | Admitting: *Deleted

## 2022-10-23 DIAGNOSIS — Z006 Encounter for examination for normal comparison and control in clinical research program: Secondary | ICD-10-CM

## 2022-10-23 MED ORDER — STUDY - ESSENCE - OLEZARSEN 50 MG, 80 MG OR PLACEBO SQ INJECTION (PI-HILTY)
50.0000 mg | INJECTION | SUBCUTANEOUS | Status: DC
  Administered 2022-10-23: 50 mg via SUBCUTANEOUS
  Filled 2022-10-23: qty 0.5

## 2022-10-23 NOTE — Research (Signed)
     TREATMENT DAY 309 - STUDY WEEK 45    Subject Number: [id number]              Randomization [id number]     Date:23-Oct-2022      [x] Vital Signs Collected - Blood Pressure:137/82 - Heart Rate:58 - Respiratory Rate:20 - Temperature:98.0 - Oxygen Saturation:94%   [x] (abdominal pain only) since last visit  [x]  Assessment of ER Visits, Hospitalizations, and Inpatient Days  [x]  Adverse Events and Concomitant Medications  [x]  Diet, Lifestyle, and Alcohol Counseling   [x]  Study Drug: [place] Injection    Mr Brocks is here for Week 45 Day 309 of [facility]ence research. He reports no abd pain, no visits to the ED or Urgent Care, and no med changes since last seen. VS taken at 0915.Marland Kitchen Scheduled next visit for May 6 at 0900. Injection given in right lower abd at 0945. Tol well. Kit number [id number].  Current Outpatient Medications:    albuterol (VENTOLIN HFA) 108 (90 Base) MCG/ACT inhaler, INHALE 2 PUFFS INTO THE LUNGS EVERY 6 HOURS AS NEEDED., Disp: 18 g, Rfl: 9   escitalopram (LEXAPRO) 20 MG tablet, Take 1 tablet (20 mg total) by mouth daily., Disp: 90 tablet, Rfl: 3   hydrochlorothiazide (HYDRODIURIL) 25 MG tablet, Take 1 tablet (25 mg total) by mouth daily., Disp: 90 tablet, Rfl: 3   hydrOXYzine (VISTARIL) 50 MG capsule, Take one cap PO HS PRN itching, Disp: 10 capsule, Rfl: 1   rosuvastatin (CRESTOR) 20 MG tablet, Take 1 tablet (20 mg total) by mouth daily., Disp: 90 tablet, Rfl: 2   Study - ESSENCE - olezarsen 50 mg, 80 mg or placebo SQ injection (PI-Hilty), Inject 50 mg into the skin every 28 (twenty-eight) days. For Investigational Use Only. Injection subcutaneously in protocol approved injection sites (abdomen, thigh or outer area of upper arm) every 4 weeks in clinic. Please contact [facility] for any questions or concerns regarding this study medication, Disp: , Rfl:    valsartan (DIOVAN) 320 MG tablet, Take 1 tablet (320 mg total) by mouth daily., Disp: 90  tablet, Rfl: 3  Current Facility-Administered Medications:    Study - ESSENCE - olezarsen 50 mg, 80 mg or placebo SQ injection (PI-Hilty), 50 mg, Subcutaneous, Q28 days, Hilty, Lisette Abu, MD, 50 mg at 10/23/22 0945

## 2022-11-23 ENCOUNTER — Other Ambulatory Visit: Payer: Self-pay

## 2022-11-23 ENCOUNTER — Encounter: Payer: PRIVATE HEALTH INSURANCE | Admitting: *Deleted

## 2022-11-23 DIAGNOSIS — Z006 Encounter for examination for normal comparison and control in clinical research program: Secondary | ICD-10-CM

## 2022-11-23 MED ORDER — STUDY - ESSENCE - OLEZARSEN 50 MG, 80 MG OR PLACEBO SQ INJECTION (PI-HILTY)
50.0000 mg | INJECTION | SUBCUTANEOUS | Status: DC
  Administered 2022-11-23: 50 mg via SUBCUTANEOUS
  Filled 2022-11-23: qty 0.5

## 2022-11-23 NOTE — Research (Signed)
     TREATMENT DAY 337 - STUDY WEEK 49    Subject Number: [id number]             Randomization Number: [id number]             Date: 23-Nov-2022      [x] Vital Signs Collected - Blood Pressure:127/67 - Heart Rate:60 - Respiratory Rate:16 - Temperature:98.3 - Oxygen Saturation:95%    [x]  (abdominal pain only) since last visit  [x]  Assessment of ER Visits, Hospitalizations, and Inpatient Days  [x]  Adverse Events and Concomitant Medications  [x]  Diet, Lifestyle, and Alcohol Counseling   [x]  Study Drug: [place] Injection   Randall Swanson is here for Week 49 Day 337. He reports no abd pain, no visits to the Ed or Urgent care, no changes in his meds. Vs taken at 0848. Injection given in right lower abd.  At 0940. Tol well. Kit number [id number]. Scheduled next visit for June 6 at 0900.     Current Outpatient Medications:    albuterol (VENTOLIN HFA) 108 (90 Base) MCG/ACT inhaler, INHALE 2 PUFFS INTO THE LUNGS EVERY 6 HOURS AS NEEDED., Disp: 18 g, Rfl: 9   escitalopram (LEXAPRO) 20 MG tablet, Take 1 tablet (20 mg total) by mouth daily., Disp: 90 tablet, Rfl: 3   hydrochlorothiazide (HYDRODIURIL) 25 MG tablet, Take 1 tablet (25 mg total) by mouth daily., Disp: 90 tablet, Rfl: 3   hydrOXYzine (VISTARIL) 50 MG capsule, Take one cap PO HS PRN itching, Disp: 10 capsule, Rfl: 1   rosuvastatin (CRESTOR) 20 MG tablet, Take 1 tablet (20 mg total) by mouth daily., Disp: 90 tablet, Rfl: 2   Study - ESSENCE - olezarsen 50 mg, 80 mg or placebo SQ injection (PI-Hilty), Inject 50 mg into the skin every 28 (twenty-eight) days. For Investigational Use Only. Injection subcutaneously in protocol approved injection sites (abdomen, thigh or outer area of upper arm) every 4 weeks in clinic. Please contact [facility] for any questions or concerns regarding this study medication, Disp: , Rfl:    valsartan (DIOVAN) 320 MG tablet, Take 1 tablet (320 mg total) by mouth daily., Disp: 90 tablet, Rfl:  3  Current Facility-Administered Medications:    Study - ESSENCE - olezarsen 50 mg, 80 mg or placebo SQ injection (PI-Hilty), 50 mg, Subcutaneous, Q28 days, Hilty, Lisette Abu, MD, 50 mg at 10/23/22 0945

## 2022-12-07 ENCOUNTER — Other Ambulatory Visit (HOSPITAL_COMMUNITY): Payer: Self-pay

## 2022-12-24 ENCOUNTER — Other Ambulatory Visit: Payer: Self-pay

## 2022-12-24 ENCOUNTER — Encounter: Payer: PRIVATE HEALTH INSURANCE | Admitting: *Deleted

## 2022-12-24 VITALS — BP 101/65 | HR 57 | Temp 98.1°F | Resp 16

## 2022-12-24 DIAGNOSIS — Z006 Encounter for examination for normal comparison and control in clinical research program: Secondary | ICD-10-CM

## 2022-12-24 NOTE — Progress Notes (Signed)
Patient seen today in follow up.  He is week 53.  He continues to do well, and notices no side effects from IP.  Denies chest pain.   Alert, oriented male in NAD BP       P       R       T     No JVD Lungs clear to A and P Cor regular without murmur Abd   soft without tenderness.    ECG  SB.  Otherwise normal.  Compared to last tracing 02/02/2015, rate slower but no  significant change.    Impression:  Week 53 Essence.  No IP today, continue folllow up.   Randall Swanson. Riley Kill, MD Medical Director, [facility]

## 2022-12-24 NOTE — Research (Addendum)
Randall Swanson Essence Week 53 Day 365 24-Dec-2022            Chemistry: Hs-C-Reactive Protein 4.2 mg/L        [x] Clinically Significant  [] Not Clinically Significant  Insulin 32.6                                         [] Clinically Significant  [x] Not Clinically Significant   HsCRP is high.  [name], MD, [facility], FACP  [place]  [facility]  Medical Director of the [facility] Diplomate of the American Board of Clinical Lipidology Attending Cardiologist  Direct Dial: [phone]  Fax: [phone]  Website:  [email or web address]          Any further action needed to be taken per the PI?  No       TREATMENT DAY 365  - STUDY WEEK 53    Subject Number:  S902            Randomization Number:  [id number]           Date:24-December-2022      [x] Vital Signs Collected - Blood Pressure: 101/65 - Weight:249.4 - Heart Rate:57 - Respiratory Rate:16 - Temperature:98.1 - Oxygen Saturation:97%  [x]  Physical Exam Completed by PI or SUB-I  [x]  12-lead ECG  [x]  Extended Urinalysis   [x]  Lab collection per protocol  [x]   (abdominal pain only) since last visit  [x]  Assessment of ER Visits, Hospitalizations, and Inpatient Days  [x]  Adverse Events and Concomitant Medications  [x]  Diet, Lifestyle, and Alcohol Counseling   [x]  Study Drug: [place] Injection   Randall Swanson is here for Week 53 day 365 of Essence research. He reports no abd pain, no visits to the Ed or urgent care, and no changes in his meds. Vs taken at 0848. Blood drawn at 0852. Urine obtained at 0910. EKG was reviewed Dr Riley Kill . Physical exam completed by Dr. Riley Kill . Next visit will be a phone call the week of July 1-7. Randall Swanson does report still  taking Xanax PRN   Current Outpatient Medications:    albuterol (VENTOLIN HFA) 108 (90 Base) MCG/ACT inhaler, INHALE 2 PUFFS INTO THE LUNGS EVERY 6 HOURS AS NEEDED., Disp: 18 g, Rfl: 9    escitalopram (LEXAPRO) 20 MG tablet, Take 1 tablet (20 mg total) by mouth daily., Disp: 90 tablet, Rfl: 3   hydrochlorothiazide (HYDRODIURIL) 25 MG tablet, Take 1 tablet (25 mg total) by mouth daily., Disp: 90 tablet, Rfl: 3   hydrOXYzine (VISTARIL) 50 MG capsule, Take one cap PO HS PRN itching, Disp: 10 capsule, Rfl: 1   rosuvastatin (CRESTOR) 20 MG tablet, Take 1 tablet (20 mg total) by mouth daily., Disp: 90 tablet, Rfl: 2   Study - ESSENCE - olezarsen 50 mg, 80 mg or placebo SQ injection (PI-Hilty), Inject 50 mg into the skin every 28 (twenty-eight) days. For Investigational Use Only. Injection subcutaneously in protocol approved injection sites (abdomen, thigh or outer area of upper arm) every 4 weeks in clinic. Please contact [facility] for any questions or concerns regarding this study medication, Disp: , Rfl:    valsartan (DIOVAN) 320 MG tablet, Take 1 tablet (320 mg total) by mouth daily., Disp: 90 tablet, Rfl: 3

## 2022-12-24 NOTE — Addendum Note (Signed)
Addended byCharlett Nose D on: 12/24/2022 03:57 PM   Modules accepted: Orders

## 2023-01-19 ENCOUNTER — Encounter: Payer: Self-pay | Admitting: *Deleted

## 2023-01-19 DIAGNOSIS — Z006 Encounter for examination for normal comparison and control in clinical research program: Secondary | ICD-10-CM

## 2023-01-19 NOTE — Research (Addendum)
Message left for Randall Swanson to call me back to see how he is doing. As part of the Essence research study.  Encouraged him to call me back.

## 2023-01-22 ENCOUNTER — Encounter: Payer: Self-pay | Admitting: *Deleted

## 2023-01-22 DIAGNOSIS — Z006 Encounter for examination for normal comparison and control in clinical research program: Secondary | ICD-10-CM

## 2023-01-22 NOTE — Research (Signed)
Spoke with Randall Swanson as part of Essence follow up phone call. He states he feels good. No changes in his meds, no visits to the Ed or Urgent care, and no abd pain. Informed him I would call him later this month for another phone call to check in on him. Voices understanding.

## 2023-02-15 ENCOUNTER — Encounter: Payer: Self-pay | Admitting: *Deleted

## 2023-02-15 DIAGNOSIS — Z006 Encounter for examination for normal comparison and control in clinical research program: Secondary | ICD-10-CM

## 2023-02-15 NOTE — Research (Signed)
Spoke with Randall Swanson  states has not had any abd pain, or s/s of pancreatitis, no changes in his meds, and no visits to the Ed or urgent care since I spoke with him last. He will call be back later to schedule his next appointment as he is coming home from [place].

## 2023-03-23 ENCOUNTER — Other Ambulatory Visit: Payer: Self-pay

## 2023-03-23 ENCOUNTER — Encounter: Payer: PRIVATE HEALTH INSURANCE | Admitting: *Deleted

## 2023-03-23 VITALS — BP 126/93 | HR 60 | Temp 98.3°F | Resp 18 | Wt 249.4 lb

## 2023-03-23 DIAGNOSIS — Z006 Encounter for examination for normal comparison and control in clinical research program: Secondary | ICD-10-CM

## 2023-03-23 NOTE — Progress Notes (Addendum)
Randall Swanson is in for his final examination.  He continues to do well.  He has been off IP for some time now, and notices no change.  The patient is getting labs and final exam.  No interval occurrences of pancreatitis or cardiac events.   Alert, oriented male in NAD BP   126/93             P   60            R  18          T 98.3 No JVD No carotid bruits Lungs clear Cor regular  Abd soft no masses Ext no edema   ECG -  SB. Otherwise normal.  When compared to prior tracing of 02/04/2015, no interval change.     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 Riley Kill, MD Medical Director, [facility]

## 2023-03-23 NOTE — Research (Signed)
     Post week 13 Essence     Subject Number: [id number]             Randomization Number: [id number]          Date: 3-Sept-2024      [x] Vital Signs Collected - Blood Pressure:126/93 - Weight:249.4 lbs - Heart Rate:60 - Respiratory Rate:18 - Temperature:98.3 - Oxygen Saturation:99%  [x]  Physical Exam Completed by PI or SUB-I  [x]  12-lead ECG  []  Pregnancy Test (If applicable)  [x]  Extended Urinalysis   [x]  Lab collection per protocol  [x]   (abdominal pain only) since last visit  [x]  Assessment of ER Visits, Hospitalizations, and Inpatient Days  [x]  Adverse Events and Concomitant Medications  [x]  Diet, Lifestyle, and Alcohol Counseling   [x]  Study Drug: [place] Injection    Randall Swanson is here for Post week 13 of Essence. He reports no changes in his meds, no abd pain, or other S&S of pancreatitis, no visits to the Ed or Urgent care since last seen.  Vs taken at 0932. Blood work drawn at [phone]. Dr Riley Kill did exam and reviewed EKG. Urine obtained at 0937.   Current Outpatient Medications:    albuterol (VENTOLIN HFA) 108 (90 Base) MCG/ACT inhaler, INHALE 2 PUFFS INTO THE LUNGS EVERY 6 HOURS AS NEEDED., Disp: 18 g, Rfl: 9   escitalopram (LEXAPRO) 20 MG tablet, Take 1 tablet (20 mg total) by mouth daily., Disp: 90 tablet, Rfl: 3   hydrochlorothiazide (HYDRODIURIL) 25 MG tablet, Take 1 tablet (25 mg total) by mouth daily., Disp: 90 tablet, Rfl: 3   hydrOXYzine (VISTARIL) 50 MG capsule, Take one cap PO HS PRN itching, Disp: 10 capsule, Rfl: 1   rosuvastatin (CRESTOR) 20 MG tablet, Take 1 tablet (20 mg total) by mouth daily., Disp: 90 tablet, Rfl: 2   valsartan (DIOVAN) 320 MG tablet, Take 1 tablet (320 mg total) by mouth daily., Disp: 90 tablet, Rfl: 3

## 2023-04-07 IMAGING — CT CT HEART MORP W/ CTA COR W/ SCORE W/ CA W/CM &/OR W/O CM
1 of 8 series · 11 of 20 positions shown, 14 images · IV contrast (omnipaque)
Comparison: Chest radiograph 02/02/2015

Addendum:
HISTORY: CAD screening, low CAD risk research study patient

EXAM:
Cardiac/Coronary  CT
TECHNIQUE: The patient was scanned on a Siemens Force scanner.
PROTOCOL: A 120 kV prospective scan was triggered in the descending thoracic
aorta at 111 HU's. Axial non-contrast 3 mm slices were carried out
through the heart. The data set was analyzed on a dedicated work
station and scored using the Agatston method. Gantry rotation speed
was 250 msecs and collimation was .6 mm. Beta blockade and 0.8 mg of
sl NTG was given. The 3D data set was reconstructed in 5% intervals
of the 35-75 % of the R-R cycle. Systolic and diastolic phases were
analyzed on a dedicated work station using MPR, MIP and VRT modes.
The patient received 100mL OMNIPAQUE IOHEXOL 350 MG/ML SOLN
contrast.

[Series 12: multiphase ts · axial · 0.45mm/px · z∈[-267,-158]mm · 11 of 2934 slices shown, 14 images]
[im 245/2934  vessel]
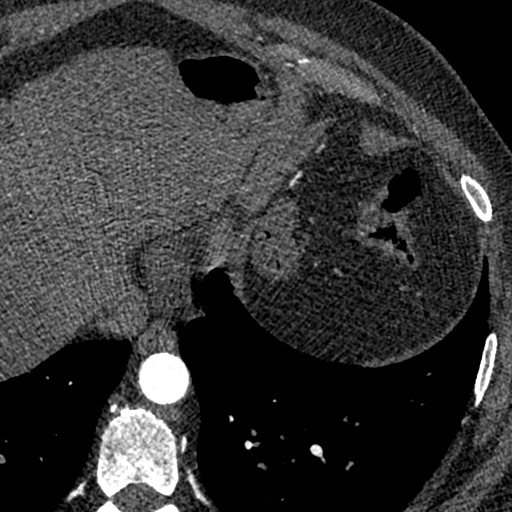
[im 245/2934  lung]
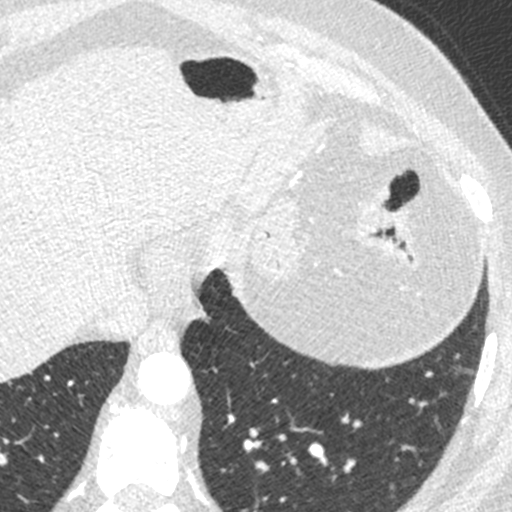
[im 489/2934  vessel]
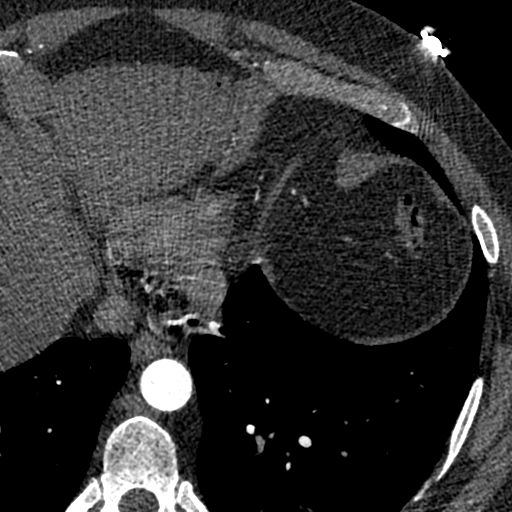
[im 734/2934  vessel]
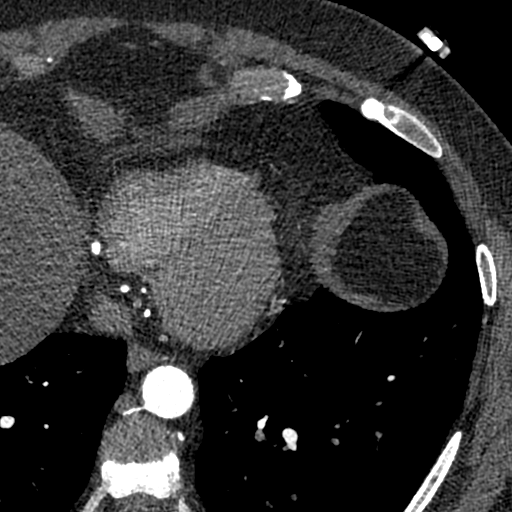
[im 978/2934  vessel]
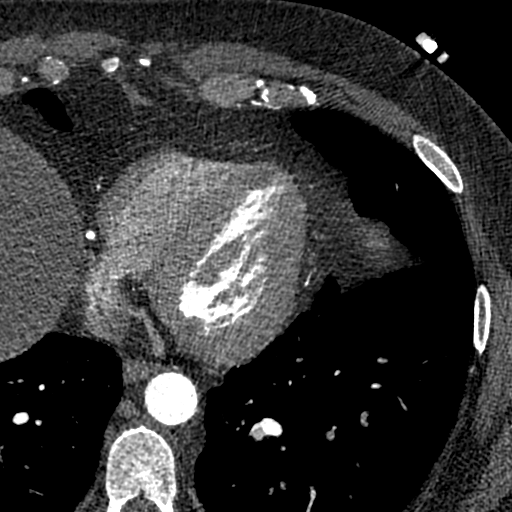
[im 1223/2934  vessel]
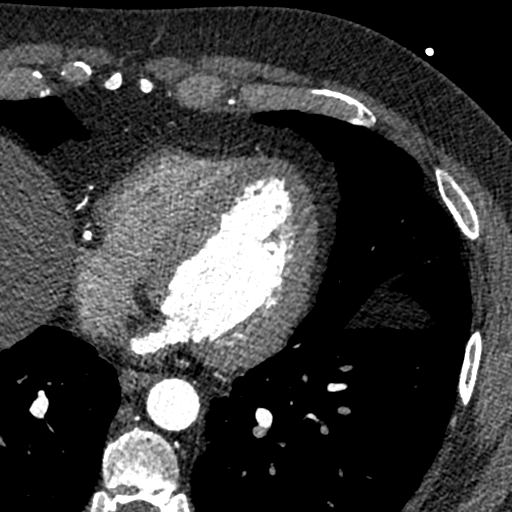
[im 1223/2934  lung]
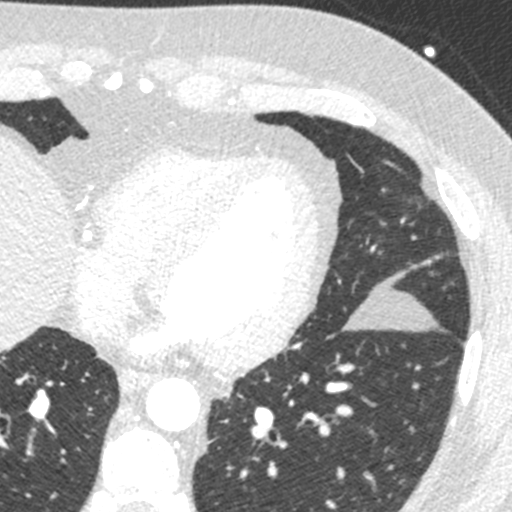
[im 1467/2934  vessel]
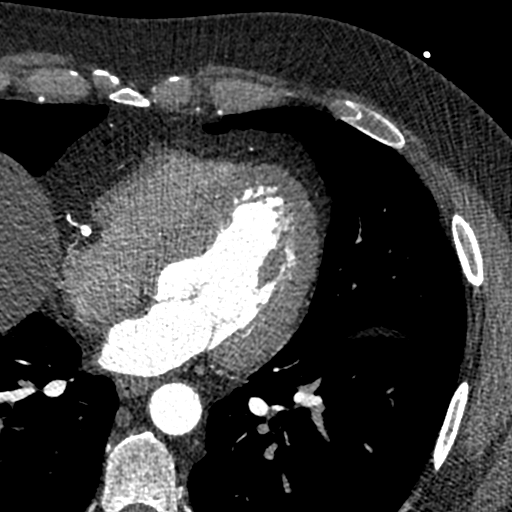
[im 1711/2934  vessel]
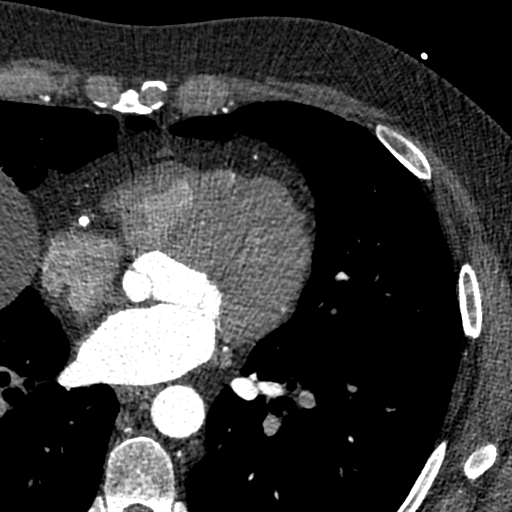
[im 1956/2934  vessel]
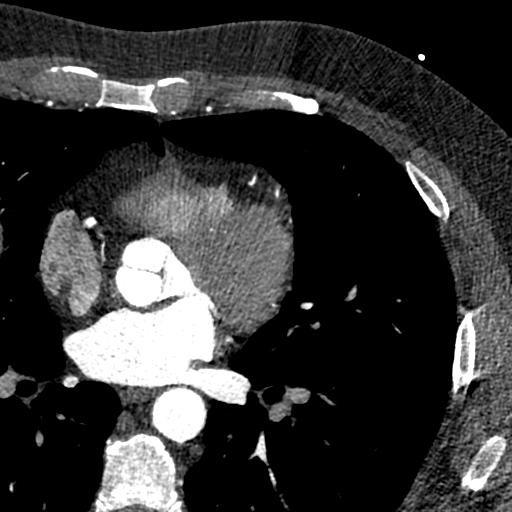
[im 2200/2934  vessel]
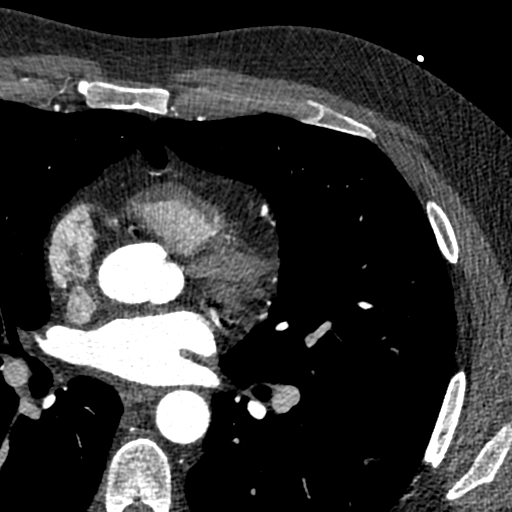
[im 2200/2934  lung]
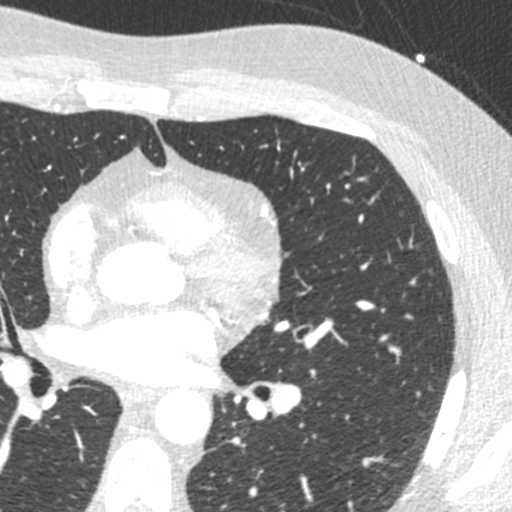
[im 2445/2934  vessel]
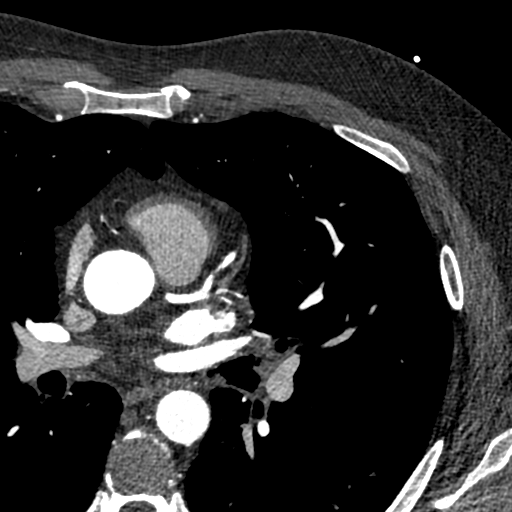
[im 2689/2934  vessel]
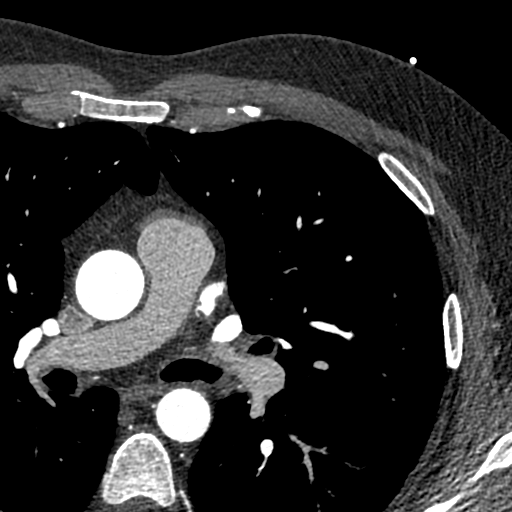

[11 of 20 positions shown; findings below may reference images not displayed]

FINDINGS: Image quality: Good

Noise artifact is: Limited

Coronary calcium score is 0.

Coronary arteries: Normal coronary origins.  Right dominance.

Right Coronary Artery: No detectable plaque or stenosis.

Left Main Coronary Artery: No detectable plaque or stenosis.

Left Anterior Descending Coronary Artery: No detectable plaque or
stenosis.

Left Circumflex Artery: No detectable plaque or stenosis.

Aorta: Normal size, 31 mm at the mid ascending aorta (level of the
PA bifurcation) measured double oblique. No calcifications. No
dissection.

Aortic Valve: No calcifications. Mild thickening of aortic valve
cusps.

Other findings:

Normal pulmonary vein drainage into the left atrium.

Normal left atrial appendage without thrombus.

Normal size of the pulmonary artery.
IMPRESSION: 1. No evidence of CAD, CADRADS = 0.

2. Coronary calcium score of 0.

3. Normal coronary origins with right dominance.

EXAM:
OVER-READ INTERPRETATION  CT CHEST

The following report is a limited chest CT over-read performed by
12/04/2021. This over-read does not include interpretation of cardiac
or coronary anatomy or pathology. The coronary CTA interpretation by
the cardiologist is attached.
FINDINGS: Cardiovascular: No significant extracardiac vascular findings.

Mediastinum/Nodes: No enlarged lymph nodes within the visualized
mediastinum.

Lungs/Pleura: There is no pleural effusion. Scattered linear
scarring or atelectasis at both lung bases. No confluent airspace
opacity or suspicious pulmonary nodule.

Upper abdomen: Hepatic low density consistent with steatosis. No
acute abnormality.

Musculoskeletal/Chest wall: No chest wall mass or suspicious osseous
findings within the visualized chest.
IMPRESSION: No significant extracardiac findings within the visualized chest.
Hepatic steatosis.

*** End of Addendum ***
FINDINGS: Image quality: Good

Noise artifact is: Limited

Coronary calcium score is 0.

Coronary arteries: Normal coronary origins.  Right dominance.

Right Coronary Artery: No detectable plaque or stenosis.

Left Main Coronary Artery: No detectable plaque or stenosis.

Left Anterior Descending Coronary Artery: No detectable plaque or
stenosis.

Left Circumflex Artery: No detectable plaque or stenosis.

Aorta: Normal size, 31 mm at the mid ascending aorta (level of the
PA bifurcation) measured double oblique. No calcifications. No
dissection.

Aortic Valve: No calcifications. Mild thickening of aortic valve
cusps.

Other findings:

Normal pulmonary vein drainage into the left atrium.

Normal left atrial appendage without thrombus.

Normal size of the pulmonary artery.
IMPRESSION: 1. No evidence of CAD, CADRADS = 0.

2. Coronary calcium score of 0.

3. Normal coronary origins with right dominance.

## 2023-10-01 ENCOUNTER — Other Ambulatory Visit: Payer: Self-pay | Admitting: Family Medicine

## 2023-10-01 DIAGNOSIS — E785 Hyperlipidemia, unspecified: Secondary | ICD-10-CM

## 2024-01-17 ENCOUNTER — Other Ambulatory Visit: Payer: Self-pay | Admitting: Family Medicine

## 2024-01-17 DIAGNOSIS — I10 Essential (primary) hypertension: Secondary | ICD-10-CM

## 2024-01-17 DIAGNOSIS — F411 Generalized anxiety disorder: Secondary | ICD-10-CM

## 2024-03-21 ENCOUNTER — Encounter: Payer: Self-pay | Admitting: *Deleted

## 2024-03-21 DIAGNOSIS — Z006 Encounter for examination for normal comparison and control in clinical research program: Secondary | ICD-10-CM

## 2024-03-21 NOTE — Research (Signed)
 Message left for Randall Swanson to inform him that he was getting placebo during the Essence research study.  I thanked him for being a part of the study, and let him know he will need to follow up about his lipid treatment. Informed him to let me know if he needs to see Dr Mona.
